# Patient Record
Sex: Female | Born: 1945 | Race: White | Hispanic: No | Smoking: Never smoker
Health system: Southern US, Community
[De-identification: ages and names within clinical notes are randomized; demographics above are authoritative.]

## PROBLEM LIST (undated history)

## (undated) DIAGNOSIS — Z860100 Personal history of colon polyps, unspecified: Secondary | ICD-10-CM

## (undated) DIAGNOSIS — F32A Depression, unspecified: Secondary | ICD-10-CM

## (undated) DIAGNOSIS — H04123 Dry eye syndrome of bilateral lacrimal glands: Secondary | ICD-10-CM

## (undated) DIAGNOSIS — F329 Major depressive disorder, single episode, unspecified: Secondary | ICD-10-CM

## (undated) DIAGNOSIS — R350 Frequency of micturition: Secondary | ICD-10-CM

## (undated) DIAGNOSIS — E785 Hyperlipidemia, unspecified: Secondary | ICD-10-CM

## (undated) DIAGNOSIS — R102 Pelvic and perineal pain: Secondary | ICD-10-CM

## (undated) DIAGNOSIS — R3915 Urgency of urination: Secondary | ICD-10-CM

## (undated) HISTORY — PX: CATARACT EXTRACTION W/ INTRAOCULAR LENS  IMPLANT, BILATERAL: SHX1307

## (undated) HISTORY — DX: Hyperlipidemia, unspecified: E78.5

## (undated) HISTORY — DX: Depression, unspecified: F32.A

## (undated) HISTORY — PX: WISDOM TOOTH EXTRACTION: SHX21

## (undated) HISTORY — DX: Major depressive disorder, single episode, unspecified: F32.9

---

## 1968-12-08 HISTORY — PX: TUMOR REMOVAL: SHX12

## 1992-04-09 HISTORY — PX: NASAL SINUS SURGERY: SHX719

## 2002-10-29 ENCOUNTER — Encounter: Payer: Self-pay | Admitting: Family Medicine

## 2002-10-29 ENCOUNTER — Encounter: Admission: RE | Admit: 2002-10-29 | Discharge: 2002-10-29 | Payer: Self-pay | Admitting: Family Medicine

## 2003-04-28 ENCOUNTER — Encounter: Admission: RE | Admit: 2003-04-28 | Discharge: 2003-04-28 | Payer: Self-pay | Admitting: Family Medicine

## 2003-05-11 ENCOUNTER — Encounter: Admission: RE | Admit: 2003-05-11 | Discharge: 2003-05-11 | Payer: Self-pay | Admitting: Family Medicine

## 2003-05-21 ENCOUNTER — Encounter: Admission: RE | Admit: 2003-05-21 | Discharge: 2003-05-21 | Payer: Self-pay | Admitting: Family Medicine

## 2004-03-22 ENCOUNTER — Other Ambulatory Visit: Admission: RE | Admit: 2004-03-22 | Discharge: 2004-03-22 | Payer: Self-pay | Admitting: Obstetrics & Gynecology

## 2004-05-03 ENCOUNTER — Encounter: Payer: Self-pay | Admitting: Internal Medicine

## 2004-08-31 ENCOUNTER — Ambulatory Visit: Payer: Self-pay | Admitting: Internal Medicine

## 2004-09-29 ENCOUNTER — Ambulatory Visit: Payer: Self-pay | Admitting: Internal Medicine

## 2004-10-05 LAB — HM COLONOSCOPY

## 2004-10-25 ENCOUNTER — Ambulatory Visit: Payer: Self-pay | Admitting: Internal Medicine

## 2005-01-17 ENCOUNTER — Ambulatory Visit: Payer: Self-pay | Admitting: Internal Medicine

## 2005-06-07 ENCOUNTER — Other Ambulatory Visit: Admission: RE | Admit: 2005-06-07 | Discharge: 2005-06-07 | Payer: Self-pay | Admitting: Obstetrics & Gynecology

## 2005-06-26 ENCOUNTER — Ambulatory Visit: Payer: Self-pay | Admitting: Internal Medicine

## 2005-07-04 ENCOUNTER — Ambulatory Visit: Payer: Self-pay | Admitting: Internal Medicine

## 2005-08-20 ENCOUNTER — Ambulatory Visit: Payer: Self-pay | Admitting: Internal Medicine

## 2005-09-17 ENCOUNTER — Ambulatory Visit: Payer: Self-pay | Admitting: Internal Medicine

## 2006-01-10 ENCOUNTER — Ambulatory Visit: Payer: Self-pay | Admitting: Internal Medicine

## 2006-02-11 ENCOUNTER — Ambulatory Visit (HOSPITAL_COMMUNITY): Admission: RE | Admit: 2006-02-11 | Discharge: 2006-02-11 | Payer: Self-pay | Admitting: Internal Medicine

## 2006-03-28 ENCOUNTER — Encounter (INDEPENDENT_AMBULATORY_CARE_PROVIDER_SITE_OTHER): Payer: Self-pay | Admitting: Specialist

## 2006-03-28 ENCOUNTER — Ambulatory Visit (HOSPITAL_COMMUNITY): Admission: RE | Admit: 2006-03-28 | Discharge: 2006-03-29 | Payer: Self-pay | Admitting: Obstetrics & Gynecology

## 2006-03-28 HISTORY — PX: LAPAROSCOPIC ASSISTED VAGINAL HYSTERECTOMY: SHX5398

## 2006-04-21 ENCOUNTER — Ambulatory Visit (HOSPITAL_COMMUNITY): Admission: AD | Admit: 2006-04-21 | Discharge: 2006-04-21 | Payer: Self-pay | Admitting: Obstetrics and Gynecology

## 2006-04-21 HISTORY — PX: REPAIR VAGINAL CUFF: SHX6067

## 2006-04-29 ENCOUNTER — Ambulatory Visit: Payer: Self-pay | Admitting: Internal Medicine

## 2006-04-29 LAB — CONVERTED CEMR LAB: Pap Smear: NORMAL

## 2006-06-11 ENCOUNTER — Ambulatory Visit: Payer: Self-pay | Admitting: Internal Medicine

## 2006-08-09 ENCOUNTER — Ambulatory Visit: Payer: Self-pay | Admitting: Internal Medicine

## 2007-03-13 ENCOUNTER — Encounter: Payer: Self-pay | Admitting: Internal Medicine

## 2007-03-13 DIAGNOSIS — F329 Major depressive disorder, single episode, unspecified: Secondary | ICD-10-CM

## 2007-03-13 DIAGNOSIS — Z860100 Personal history of colon polyps, unspecified: Secondary | ICD-10-CM | POA: Insufficient documentation

## 2007-03-13 DIAGNOSIS — F3289 Other specified depressive episodes: Secondary | ICD-10-CM | POA: Insufficient documentation

## 2007-03-13 DIAGNOSIS — J45909 Unspecified asthma, uncomplicated: Secondary | ICD-10-CM | POA: Insufficient documentation

## 2007-03-13 DIAGNOSIS — N318 Other neuromuscular dysfunction of bladder: Secondary | ICD-10-CM

## 2007-03-13 DIAGNOSIS — J309 Allergic rhinitis, unspecified: Secondary | ICD-10-CM | POA: Insufficient documentation

## 2007-03-13 DIAGNOSIS — E785 Hyperlipidemia, unspecified: Secondary | ICD-10-CM

## 2007-04-16 ENCOUNTER — Ambulatory Visit: Payer: Self-pay | Admitting: Internal Medicine

## 2007-04-16 ENCOUNTER — Telehealth: Payer: Self-pay | Admitting: Internal Medicine

## 2007-07-22 ENCOUNTER — Ambulatory Visit: Payer: Self-pay | Admitting: Internal Medicine

## 2007-10-06 ENCOUNTER — Telehealth (INDEPENDENT_AMBULATORY_CARE_PROVIDER_SITE_OTHER): Payer: Self-pay | Admitting: *Deleted

## 2007-10-06 ENCOUNTER — Telehealth: Payer: Self-pay | Admitting: Internal Medicine

## 2007-10-06 ENCOUNTER — Ambulatory Visit: Payer: Self-pay | Admitting: Internal Medicine

## 2007-10-06 DIAGNOSIS — R1013 Epigastric pain: Secondary | ICD-10-CM

## 2007-10-07 ENCOUNTER — Encounter: Payer: Self-pay | Admitting: Internal Medicine

## 2007-10-15 ENCOUNTER — Ambulatory Visit: Payer: Self-pay | Admitting: Internal Medicine

## 2007-10-15 LAB — CONVERTED CEMR LAB
ALT: 15 units/L (ref 0–35)
AST: 18 units/L (ref 0–37)
Albumin: 3.6 g/dL (ref 3.5–5.2)
Alkaline Phosphatase: 51 units/L (ref 39–117)
BUN: 18 mg/dL (ref 6–23)
Basophils Absolute: 0 10*3/uL (ref 0.0–0.1)
Basophils Relative: 0.8 % (ref 0.0–1.0)
Bilirubin Urine: NEGATIVE
Bilirubin, Direct: 0.1 mg/dL (ref 0.0–0.3)
CO2: 32 meq/L (ref 19–32)
CRP, High Sensitivity: 1 (ref 0.00–5.00)
Calcium: 9.2 mg/dL (ref 8.4–10.5)
Chloride: 104 meq/L (ref 96–112)
Cholesterol: 231 mg/dL (ref 0–200)
Creatinine, Ser: 0.7 mg/dL (ref 0.4–1.2)
Direct LDL: 130.7 mg/dL
Eosinophils Absolute: 0.7 10*3/uL (ref 0.0–0.7)
Eosinophils Relative: 17 % — ABNORMAL HIGH (ref 0.0–5.0)
GFR calc Af Amer: 110 mL/min
GFR calc non Af Amer: 91 mL/min
Glucose, Bld: 96 mg/dL (ref 70–99)
HCT: 39.5 % (ref 36.0–46.0)
HDL: 85 mg/dL (ref >39.0)
Hemoglobin, Urine: NEGATIVE
Hemoglobin: 13.5 g/dL (ref 12.0–15.0)
Ketones, ur: NEGATIVE mg/dL
Leukocytes, UA: NEGATIVE
Lymphocytes Relative: 25.3 % (ref 12.0–46.0)
MCHC: 34.1 g/dL (ref 30.0–36.0)
MCV: 95.5 fL (ref 78.0–100.0)
Monocytes Absolute: 0.5 10*3/uL (ref 0.1–1.0)
Monocytes Relative: 10.7 % (ref 3.0–12.0)
Neutro Abs: 2 10*3/uL (ref 1.4–7.7)
Neutrophils Relative %: 46.2 % (ref 43.0–77.0)
Nitrite: NEGATIVE
Platelets: 176 10*3/uL (ref 150–400)
Potassium: 5.1 meq/L (ref 3.5–5.1)
RBC: 4.13 M/uL (ref 3.87–5.11)
RDW: 13.2 % (ref 11.5–14.6)
Sodium: 141 meq/L (ref 135–145)
Specific Gravity, Urine: 1.01 (ref 1.000–1.03)
TSH: 3.2 microintl units/mL (ref 0.35–5.50)
Total Bilirubin: 0.9 mg/dL (ref 0.3–1.2)
Total CHOL/HDL Ratio: 2.7
Total Protein, Urine: NEGATIVE mg/dL
Total Protein: 6.1 g/dL (ref 6.0–8.3)
Triglycerides: 53 mg/dL (ref 0–149)
Urine Glucose: NEGATIVE mg/dL
Urobilinogen, UA: 0.2 (ref 0.0–1.0)
VLDL: 11 mg/dL (ref 0–40)
WBC: 4.3 10*3/uL — ABNORMAL LOW (ref 4.5–10.5)
pH: 7 (ref 5.0–8.0)

## 2007-10-21 ENCOUNTER — Ambulatory Visit: Payer: Self-pay | Admitting: Internal Medicine

## 2007-10-21 DIAGNOSIS — L259 Unspecified contact dermatitis, unspecified cause: Secondary | ICD-10-CM

## 2007-11-18 ENCOUNTER — Telehealth: Payer: Self-pay | Admitting: Internal Medicine

## 2007-11-20 ENCOUNTER — Encounter: Payer: Self-pay | Admitting: Internal Medicine

## 2008-02-04 LAB — CONVERTED CEMR LAB: Pap Smear: NORMAL

## 2008-08-07 HISTORY — PX: BUNIONECTOMY: SHX129

## 2008-10-26 ENCOUNTER — Telehealth: Payer: Self-pay | Admitting: Family Medicine

## 2008-11-08 ENCOUNTER — Ambulatory Visit: Payer: Self-pay | Admitting: Internal Medicine

## 2008-11-30 ENCOUNTER — Ambulatory Visit: Payer: Self-pay | Admitting: Internal Medicine

## 2008-11-30 LAB — CONVERTED CEMR LAB
ALT: 14 units/L (ref 0–35)
AST: 18 units/L (ref 0–37)
Albumin: 3.8 g/dL (ref 3.5–5.2)
Alkaline Phosphatase: 48 units/L (ref 39–117)
BUN: 22 mg/dL (ref 6–23)
Basophils Absolute: 0 10*3/uL (ref 0.0–0.1)
Basophils Relative: 0.2 % (ref 0.0–3.0)
Bilirubin, Direct: 0.1 mg/dL (ref 0.0–0.3)
CO2: 32 meq/L (ref 19–32)
Calcium: 9.1 mg/dL (ref 8.4–10.5)
Chloride: 106 meq/L (ref 96–112)
Cholesterol: 228 mg/dL — ABNORMAL HIGH (ref 0–200)
Creatinine, Ser: 0.7 mg/dL (ref 0.4–1.2)
Direct LDL: 139.2 mg/dL
Eosinophils Absolute: 0.6 10*3/uL (ref 0.0–0.7)
Eosinophils Relative: 11.3 % — ABNORMAL HIGH (ref 0.0–5.0)
GFR calc non Af Amer: 90.15 mL/min (ref >60)
Glucose, Bld: 72 mg/dL (ref 70–99)
HCT: 40 % (ref 36.0–46.0)
HDL: 80.6 mg/dL (ref >39.00)
Hemoglobin: 13.5 g/dL (ref 12.0–15.0)
Lymphocytes Relative: 28 % (ref 12.0–46.0)
Lymphs Abs: 1.4 10*3/uL (ref 0.7–4.0)
MCHC: 33.8 g/dL (ref 30.0–36.0)
MCV: 97.2 fL (ref 78.0–100.0)
Monocytes Absolute: 0.5 10*3/uL (ref 0.1–1.0)
Monocytes Relative: 10.1 % (ref 3.0–12.0)
Neutro Abs: 2.4 10*3/uL (ref 1.4–7.7)
Neutrophils Relative %: 50.4 % (ref 43.0–77.0)
Platelets: 173 10*3/uL (ref 150.0–400.0)
Potassium: 4.7 meq/L (ref 3.5–5.1)
RBC: 4.11 M/uL (ref 3.87–5.11)
RDW: 13.4 % (ref 11.5–14.6)
Sodium: 141 meq/L (ref 135–145)
TSH: 4.9 microintl units/mL (ref 0.35–5.50)
Total Bilirubin: 0.8 mg/dL (ref 0.3–1.2)
Total CHOL/HDL Ratio: 3
Total Protein: 6.4 g/dL (ref 6.0–8.3)
Triglycerides: 70 mg/dL (ref 0.0–149.0)
VLDL: 14 mg/dL (ref 0.0–40.0)
Vit D, 25-Hydroxy: 28 ng/mL — ABNORMAL LOW (ref 30–89)
WBC: 4.9 10*3/uL (ref 4.5–10.5)

## 2008-12-02 ENCOUNTER — Telehealth: Payer: Self-pay | Admitting: Internal Medicine

## 2009-01-31 LAB — CONVERTED CEMR LAB: Pap Smear: NORMAL

## 2009-04-11 ENCOUNTER — Ambulatory Visit: Payer: Self-pay | Admitting: Family

## 2009-04-13 ENCOUNTER — Telehealth: Payer: Self-pay | Admitting: Internal Medicine

## 2009-04-22 ENCOUNTER — Ambulatory Visit: Payer: Self-pay | Admitting: Internal Medicine

## 2009-05-23 ENCOUNTER — Ambulatory Visit: Payer: Self-pay | Admitting: Internal Medicine

## 2009-05-23 ENCOUNTER — Telehealth: Payer: Self-pay | Admitting: Internal Medicine

## 2009-05-24 ENCOUNTER — Encounter: Payer: Self-pay | Admitting: Internal Medicine

## 2009-09-09 ENCOUNTER — Ambulatory Visit: Payer: Self-pay | Admitting: Internal Medicine

## 2009-10-04 ENCOUNTER — Telehealth: Payer: Self-pay | Admitting: Internal Medicine

## 2009-10-06 ENCOUNTER — Encounter: Payer: Self-pay | Admitting: Internal Medicine

## 2009-11-29 ENCOUNTER — Encounter: Payer: Self-pay | Admitting: Internal Medicine

## 2009-11-29 ENCOUNTER — Ambulatory Visit: Payer: Self-pay | Admitting: Family

## 2009-11-29 LAB — CONVERTED CEMR LAB
Bilirubin Urine: NEGATIVE
Glucose, Urine, Semiquant: NEGATIVE
Ketones, urine, test strip: NEGATIVE
Nitrite: POSITIVE
Protein, U semiquant: NEGATIVE
Specific Gravity, Urine: 1.015
Urobilinogen, UA: 0.2
WBC Urine, dipstick: NEGATIVE
pH: 5

## 2009-12-05 ENCOUNTER — Telehealth: Payer: Self-pay | Admitting: Family

## 2009-12-06 ENCOUNTER — Ambulatory Visit: Payer: Self-pay | Admitting: Diagnostic Radiology

## 2009-12-06 ENCOUNTER — Ambulatory Visit: Payer: Self-pay | Admitting: Family

## 2009-12-06 ENCOUNTER — Ambulatory Visit (HOSPITAL_BASED_OUTPATIENT_CLINIC_OR_DEPARTMENT_OTHER): Admission: RE | Admit: 2009-12-06 | Discharge: 2009-12-06 | Payer: Self-pay | Admitting: Internal Medicine

## 2009-12-06 DIAGNOSIS — R3129 Other microscopic hematuria: Secondary | ICD-10-CM

## 2009-12-13 ENCOUNTER — Encounter: Payer: Self-pay | Admitting: Internal Medicine

## 2010-01-19 LAB — CONVERTED CEMR LAB

## 2010-01-20 ENCOUNTER — Ambulatory Visit: Payer: Self-pay | Admitting: Internal Medicine

## 2010-01-20 LAB — HM MAMMOGRAPHY

## 2010-01-30 ENCOUNTER — Telehealth: Payer: Self-pay | Admitting: Internal Medicine

## 2010-01-31 ENCOUNTER — Ambulatory Visit: Payer: Self-pay | Admitting: Internal Medicine

## 2010-01-31 DIAGNOSIS — J018 Other acute sinusitis: Secondary | ICD-10-CM

## 2010-02-14 ENCOUNTER — Telehealth: Payer: Self-pay | Admitting: Internal Medicine

## 2010-05-08 ENCOUNTER — Ambulatory Visit (INDEPENDENT_AMBULATORY_CARE_PROVIDER_SITE_OTHER)
Admission: RE | Admit: 2010-05-08 | Discharge: 2010-05-08 | Payer: BC Managed Care – PPO | Source: Home / Self Care | Attending: Internal Medicine | Admitting: Internal Medicine

## 2010-05-08 DIAGNOSIS — Z780 Asymptomatic menopausal state: Secondary | ICD-10-CM

## 2010-05-08 DIAGNOSIS — F329 Major depressive disorder, single episode, unspecified: Secondary | ICD-10-CM

## 2010-05-08 DIAGNOSIS — J018 Other acute sinusitis: Secondary | ICD-10-CM

## 2010-05-09 NOTE — Medication Information (Signed)
Summary: Prior Authorization for Fexofenadine/[facility]  Prior Authorization for Fexofenadine/[facility]   Imported By: Lanelle Bal 10/13/2009 12:38:34  _____________________________________________________________________  External Attachment:    Type:   Image     Comment:   External Document

## 2010-05-09 NOTE — Progress Notes (Signed)
Summary: refill clarification  Phone Note Call from Patient Call back at [phone]   Caller: Patient Call For: D. Thomos Lemons DO Summary of Call: Received voice message from pt stating that she had not received refills on Vesicare and Cymbalta. Stated that refills were supposed to have been sent to [facility]. Pt has also checked with [facility] and they do not have Rxs.  [employer] and spoke to [employer]. She verified that they did not receive electronic Rxs.  Refills given verbally per 01/20/10 office visit. Pt notified and will call us back for a local supply if she has not received Rxs from [facility] before she runs out. Nicki Guadalajara Fergerson CMA Duncan Dull)  February 14, 2010 11:07 AM

## 2010-05-09 NOTE — Progress Notes (Signed)
Summary: Fexofenadine Refill  Phone Note From Pharmacy   Caller: [facility] [address] [phone]* Summary of Call: Faxed recieved from [facility] for Prior Authorization on Fexofenadine. Prior Authorization questionaire has been received, awaiting signature from Dr Artist Pais Initial call taken by: Glendell Docker CMA,  October 04, 2009 11:08 AM  Follow-up for Phone Call        call placed to patient to verify that she is still taking the medication. Patient states that she hasd not taken the medication for awhile, but she is having such a bad time wtih allergies she is going to need something that does not make her sleepy. She states that she has tried claritin, zyrtec which does nothing for her and xyzal cost too much and she can not afford the cost of that medication. Follow-up by: Glendell Docker CMA,  October 04, 2009 11:13 AM  Additional Follow-up for Phone Call Additional follow up Details #1::        questionaire faxed to [facility], awaiting approval/denial status Additional Follow-up by: Glendell Docker CMA,  October 06, 2009 2:49 PM    Additional Follow-up for Phone Call Additional follow up Details #2::    Approval received from [facility] from 09/15/09 through 10/06/10. Left message on pharmacy voicemail and pt cell voicemail that med has been approved.  Nicki Guadalajara Fergerson CMA Duncan Dull)  October 07, 2009 9:04 AM

## 2010-05-09 NOTE — Progress Notes (Signed)
Summary: UTI  Phone Note Call from Patient Call back at [phone]   Caller: Patient Call For: Peggyann Juba, NP Summary of Call: Pt states she has completed antibiotic for UTI and is still having lower back pain & some urinary frequency. Does pt need another round of abx or should she see a urologist?  Please advise. Nicki Guadalajara Fergerson CMA Duncan Dull)  December 05, 2009 8:52 AM   Follow-up for Phone Call        Returned pt's call +low back pain- bilateral.  Some bladder discomfort.  Case discussed with Dr. Artist Pais, will try Azo 2 tabs PO TID PRN and increase fluid intake.  She should try this before we consider referral to urology. Please instruct patient  to call if symptoms worsen or do not improve.  Follow-up by: Lemont Fillers FNP,  December 05, 2009 2:17 PM  Additional Follow-up for Phone Call Additional follow up Details #1::        Pt notified of Melissa's instructions and voices understanding. Nicki Guadalajara Fergerson CMA Duncan Dull)  December 05, 2009 2:41 PM     New/Updated Medications: AZO-STANDARD 95 MG TABS (PHENAZOPYRIDINE HCL) 2 tabs by mouth three times a day as needed for bladder pain. l

## 2010-05-09 NOTE — Assessment & Plan Note (Signed)
Summary: 2 wk. f/u -per Dr.Yoo- jr   Vital Signs:  Patient profile:   65 year old female Weight:      121.25 pounds BMI:     19.79 O2 Sat:      100 % on Room air Temp:     98.6 degrees F oral Pulse rate:   69 / minute Pulse rhythm:   regular Resp:     18 per minute BP sitting:   120 / 80  (right arm) Cuff size:   regular  Vitals Entered By: Glendell Docker CMA (April 22, 2009 3:36 PM)  O2 Flow:  Room air  Contraindications/Deferment of Procedures/Staging:    Test/Procedure: FLU VAX    Reason for deferment: patient declined   Primary Care Provider:  DThomos Lemons DO  CC:  2 Week follow up medication.  History of Present Illness: 2 Week follow up   65 y/o white female for f/u with anxiety/depression - stress rxn.  she broke up relationship with significant other.  she is having tough time sleeping. wellbutrin not helping.   feeling overwhelmed.  no morbid thoughts.  she is seeing counselor - Loraine Maple.      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   Social History: Separated  Never Smoked Alcohol use-yes (socially) Occupation:  Cytogeneticist of undergraduate admissions   Review of Systems       chronic insomnia .  does not want to take ambien long term due to fear of dependency  Physical Exam  General:  alert, well-developed, and well-nourished.   Lungs:  normal respiratory effort, normal breath sounds, no crackles, and no wheezes.   Heart:  normal rate, regular rhythm, and no gallop.   Neurologic:  cranial nerves II-XII intact and gait normal.   Psych:   normally interactive, good eye contact, not anxious appearing, and not depressed appearing.     Impression & Recommendations:  Problem # 1:  DEPRESSION (ICD-311) Assessment Unchanged Change to Lexapro.  DC wellbutrin.  samples of lexapro provided.  use lorazepam to facilitate sleep.  continue counseling.  Patient advised to call office if symptoms persist or worsen. f/u 1 month The following medications were removed from the medication list:    Citalopram Hydrobromide 40 Mg Tabs (Citalopram hydrobromide) .Marland Kitchen... Take 1 tablet by mouth once a day    Wellbutrin Xl 150 Mg Xr24h-tab (Bupropion hcl) ..... One tablet by mouth daily x 4 days, then increase to two tablets by mouth daily Her updated medication list for this problem includes:    Lexapro 20 Mg Tabs (Escitalopram oxalate) ..... One by mouth qd    Lorazepam 1 Mg Tabs (Lorazepam) ..... One by mouth at bedtime as needed  Complete Medication List: 1)  Vesicare 5 Mg Tabs (Solifenacin succinate) .... Take 1 tablet by mouth once a day 2)  Vivelle-dot 0.1 Mg/24hr Pttw (Estradiol) .... Apply 1 patch to skin twice a week 3)  Lexapro 20 Mg Tabs (Escitalopram oxalate) .... One by mouth qd 4)  Lorazepam 1 Mg Tabs (Lorazepam) .... One by mouth at bedtime as needed  Patient Instructions: 1)  Please schedule a follow-up appointment in 1 month. Prescriptions: LORAZEPAM 1 MG TABS (LORAZEPAM) one by mouth at bedtime as needed  #30 x 1   Entered and Authorized by:   D. Thomos Lemons DO   Signed by:   D. Thomos Lemons DO on 04/22/2009   Method used:   Print then Give to Patient   RxID:   [id number] LEXAPRO 20 MG TABS (ESCITALOPRAM OXALATE) one by mouth qd  #30 x 2   Entered and Authorized by:   D. Thomos Lemons DO   Signed by:   D. Thomos Lemons DO on 04/22/2009   Method used:   Electronically to        [facility] [address] # [phone]* (retail)       [address]       [place], [place]  [address]       Ph: [phone]       Fax: [phone]   RxID:    [id number]   Current Allergies (reviewed today): No known allergies    Preventive Care Screening  Mammogram:    Date:  01/04/2009    Results:  normal     Immunization History:  Influenza Immunization History:    Influenza:  declined (04/22/2009)

## 2010-05-09 NOTE — Progress Notes (Signed)
Summary: Lexapro-Prior Auth  Phone Note Outgoing Call   Call placed by: Glendell Docker CMA,  May 23, 2009 4:43 PM Call placed to: Insurer Summary of Call: Prior Authorization on Lexapro 20mg  .  Rx authorization awaiting approval.  [id number] Initial call taken by: Glendell Docker CMA,  May 23, 2009 4:55 PM  Follow-up for Phone Call        Lexapro has been n approved and authorized for a period of one year Follow-up by: Glendell Docker CMA,  May 24, 2009 2:43 PM     Appended Document: Lexapro-Prior Auth Rx approved from 05/03/2009 through 04/08/2098

## 2010-05-09 NOTE — Progress Notes (Signed)
Summary: [facility] case # [id number] for fexofenadine 180 mg   Phone Note Outgoing Call   Call placed by: Marj Call placed to: [facility]  Summary of Call: [facility] case # [id number] for Fexofenadine 180mg  tablets prior auth.  Form to Darlene  Initial call taken by: Roselle Locus,  October 04, 2009 9:54 AM

## 2010-05-09 NOTE — Progress Notes (Signed)
Summary: Status Update  Phone Note Call from Patient Call back at Home Phone [phone]   Caller: Patient Call For: D. Thomos Lemons DO Summary of Call: patient called and left voice message stating she is having some allergy related problems. Her message states she is scheduled to fly out to [place] on Thursday, and she would like to know if Dr Artist Pais would prescribe her some anitbiotics. She states she is having chest congestion, and sore throat. Initial call taken by: Glendell Docker CMA,  January 30, 2010 11:24 AM  Follow-up for Phone Call        no, I can not rx abx with examining pt.   Follow-up by: D. Thomos Lemons DO,  January 30, 2010 12:17 PM  Additional Follow-up for Phone Call Additional follow up Details #1::        call placed to patient at [phone], she has been advised per Dr Artist Pais instructions. Appointment scheduled for  10/25 @ 10am Additional Follow-up by: Glendell Docker CMA,  January 30, 2010 1:59 PM

## 2010-05-09 NOTE — Assessment & Plan Note (Signed)
Summary: POSS UTI/DT--Rm 4   Vital Signs:  Patient profile:   65 year old female Height:      65.5 inches Weight:      129 pounds BMI:     21.22 Temp:     98.1 degrees F oral Pulse rate:   60 / minute Pulse rhythm:   regular Resp:     16 per minute BP sitting:   100 / 76  (right arm) Cuff size:   regular  Vitals Entered By: Mervin Kung CMA Duncan Dull) (November 29, 2009 3:34 PM) CC: Room 3   Abdominal discomfort x 1 week, urinary frequency, feels achy all over. Would like refills on Vesicare and Cymbalta. Is Patient Diabetic? No   Primary Care Ronnetta Currington:  Dondra Spry DO  CC:  Room 3   Abdominal discomfort x 1 week, urinary frequency, and feels achy all over. Would like refills on Vesicare and Cymbalta..  History of Present Illness: Ms Philbin is a 65 year old female who presents today with complaint of suprapubic abdominal pain x 1 week.  Felt feverish this AM- but did not check temperature. Denies dysuria, or hematuria. Notes+frequency or urination- but also has history of overactive bladder.   +bilateral low back pain.  Notes that she has felt achey.   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 Lungs are clear to auscultation, no crackles or wheezes. Heart:  Normal rate and regular rhythm. S1 and S2 normal without gallop, murmur, click, rub or other extra sounds. Abdomen:  Soft, + bowel sounds.  + suprapubic tenderness to palpation without guarding. Msk:  Neg CVAT bilaterally   Impression &  Recommendations:  Problem # 1:  UTI (ICD-599.0) Assessment New Will treat with Cipro, send for culture.  Pt instructed to call if symptoms worsen or do not improve. Her updated medication list for this problem includes:    Vesicare 5 Mg Tabs (Solifenacin succinate) .Marland Kitchen... Take 1 tablet by mouth once a day    Cipro 500 Mg Tabs (Ciprofloxacin hcl) ..... One tab by mouth two times a day x 7 days  Orders: T-Culture, Urine ([id number]) Specimen Handling (09811) UA Dipstick w/o Micro (manual) (81002)  Complete Medication List: 1)  Vesicare 5 Mg Tabs (Solifenacin succinate) .... Take 1 tablet by mouth once a day 2)  Vivelle-dot 0.1 Mg/24hr Pttw (Estradiol) .... Apply 1 patch to skin twice a week 3)  Lorazepam 1 Mg Tabs (Lorazepam) .... One by mouth at bedtime as needed 4)  Cymbalta 60 Mg Cpep (Duloxetine hcl) .... One by mouth once daily 5)  Vigamox 0.5 % Soln (Moxifloxacin hcl) .Marland Kitchen.. 1 drop three times a day to both eyes 6)  Bromday 0.09 % Soln (Bromfenac sodium) .Marland Kitchen.. 1 drop three times a day in both eyes. 7)  Durezol 0.05 % Emul (Difluprednate) .Marland Kitchen.. 1 drop three times a day in both eyes. 8)  Cipro 500 Mg Tabs (Ciprofloxacin hcl) .... One tab by mouth two times a day x 7 days  Patient Instructions: 1)  Call if fever over 101, worsening low back pain, or if  your symptoms do not improve. Prescriptions: CIPRO 500 MG TABS (CIPROFLOXACIN HCL) one tab by mouth two times a day x 7 days  #14 x 0   Entered and Authorized by:   Lemont Fillers FNP   Signed by:   Lemont Fillers FNP on 11/29/2009   Method used:   Electronically to        [facility]. [phone]* (retail)       [address]       [place], [place]  [address]       Ph: [phone]       Fax: [phone]   RxID:   [id number]   Current Allergies (reviewed today): No known allergies   Laboratory Results   Urine Tests  Date/Time Received: 11/29/09 Date/Time Reported: Mervin Kung CMA (AAMA)  November 29, 2009 3:54 PM   Routine Urinalysis   Color: yellow Appearance: Clear Glucose: negative   (Normal Range: Negative) Bilirubin: negative   (Normal Range: Negative) Ketone: negative   (Normal Range: Negative) Spec. Gravity: 1.015   (Normal Range: 1.003-1.035) Blood: moderate   (Normal Range: Negative) pH: 5.0   (Normal Range: 5.0-8.0) Protein: negative   (Normal Range: Negative) Urobilinogen: 0.2   (Normal Range: 0-1) Nitrite: positive   (Normal Range: Negative) Leukocyte Esterace: negative   (Normal Range: Negative)    Comments: Urine cultured. Nicki Guadalajara Fergerson CMA Duncan Dull)  November 29, 2009 3:54 PM

## 2010-05-09 NOTE — Assessment & Plan Note (Signed)
Summary: anxiety/sleep deprived/hea   Vital Signs:  Patient profile:   64 year old female Weight:      124.75 pounds Pulse rate:   64 / minute BP sitting:   120 / 80  Vitals Entered By: Kandice Hams (April 11, 2009 4:03 PM) CC: c/o increased anxiety pt on citalopram , also needs somemthing to help her sleep   Primary Care Provider:  Dondra Spry DO  CC:  c/o increased anxiety pt on citalopram  and also needs somemthing to help her sleep.  History of Present Illness: Allison Riley is a 65 year old female who presents today with c/o depression as well as insomnia.  Notes that she recently had some "devastating news" which has made her depresssion much worse.    Allergies: No Known Drug Allergies  Review of Systems       patient notes difficulty sleeping- falls asleep but then wakes up several times a day.  Patient notes increased tearfulness,  doesn't feel that the citalopram is helping her.     Impression & Recommendations:  Problem # 1:  DEPRESSION (ICD-311) Assessment Deteriorated Will add wellbutrin to patient's citalopram and plan to bring her back in 1 month for further evaluation. Will also add ambien on a as needed basis for insomnia. Her updated medication list for this problem includes:    Citalopram Hydrobromide 40 Mg Tabs (Citalopram hydrobromide) .Marland Kitchen... Take 1 tablet by mouth once a day    Wellbutrin Xl 150 Mg Xr24h-tab (Bupropion hcl) ..... One tablet by mouth daily x 4 days, then increase to two tablets by mouth daily  Complete Medication List: 1)  Citalopram Hydrobromide 40 Mg Tabs (Citalopram hydrobromide) .... Take 1 tablet by mouth once a day 2)  Vesicare 5 Mg Tabs (Solifenacin succinate) .... Take 1 tablet by mouth once a day 3)  Vivelle-dot 0.1 Mg/24hr Pttw (Estradiol) .... Apply 1 patch to skin twice a week 4)  Fexofenadine Hcl 180 Mg Tabs (Fexofenadine hcl) .... Take 1 tablet by mouth once a day 5)  Ambien 5 Mg Tabs (Zolpidem tartrate) .... One tablet by  mouth daily 6)  Wellbutrin Xl 150 Mg Xr24h-tab (Bupropion hcl) .... One tablet by mouth daily x 4 days, then increase to two tablets by mouth daily  Patient Instructions: 1)  Please schedule a follow-up appointment in 1 month. 2)  Call us if your symptoms of insomnia or depression become worse or do not improve in the next few weeks.  Should you ever develop any suicidal thoughts- please go directly to the ER.  Prescriptions: WELLBUTRIN XL 150 MG XR24H-TAB (BUPROPION HCL) one tablet by mouth daily x 4 days, then increase to two tablets by mouth daily  #60 x 1   Entered and Authorized by:   Lemont Fillers FNP   Signed by:   Lemont Fillers FNP on 04/11/2009   Method used:   Electronically to        [facility] [address] # [phone]* (retail)       [address]       [place], [place]  [address]       Ph: [phone]       Fax: [phone]   RxID:   [id number] AMBIEN 5 MG TABS (ZOLPIDEM TARTRATE) one tablet by mouth daily  #30 x 0   Entered and Authorized by:   Lemont Fillers FNP   Signed by:   Lemont Fillers FNP on 04/11/2009   Method used:  Print then Give to Patient   RxID:   [id number]

## 2010-05-09 NOTE — Medication Information (Signed)
Summary: Approval for Fexofenadine/[facility]  Approval for Fexofenadine/[facility]   Imported By: Lanelle Bal 10/14/2009 09:23:42  _____________________________________________________________________  External Attachment:    Type:   Image     Comment:   External Document

## 2010-05-09 NOTE — Consult Note (Signed)
Summary: [facility]  [facility]   Imported By: Lanelle Bal 12/22/2009 12:32:26  _____________________________________________________________________  External Attachment:    Type:   Image     Comment:   External Document

## 2010-05-09 NOTE — Assessment & Plan Note (Signed)
Summary: FU/MHF   Vital Signs:  Patient profile:   65 year old female Weight:      126.25 pounds BMI:     20.61 O2 Sat:      98 % on Room air Temp:     98.4 degrees F oral Pulse rate:   54 / minute Pulse rhythm:   regular Resp:     16 per minute BP sitting:   120 / 72  (right arm) Cuff size:   regular  Vitals Entered By: Glendell Docker CMA (May 23, 2009 3:31 PM)  O2 Flow:  Room air  Primary Care Provider:  D. Thomos Lemons DO  CC:  Follow up on Lexapro.  History of Present Illness: 65 y/o white female for follow up. depressive symptoms improved since prev visit. good response to lexapro sleep improved with lorazepam no irritabiltiy  pt previously on citalopram and sertraline.  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    Social History: Separated  Never Smoked Alcohol use-yes (socially) Occupation:  Cytogeneticist of undergraduate admissions    Physical Exam  General:  alert, well-developed, and well-nourished.   Lungs:  normal respiratory effort and normal breath sounds.   Heart:  normal rate, regular rhythm, and no gallop.   Neurologic:  cranial nerves II-XII intact and gait normal.   Psych:  normally interactive and good eye contact.     Impression & Recommendations:  Problem # 1:  DEPRESSION (ICD-311) Assessment Improved good response to lexapro 20 mg.   she was prev on citalopram and sertraline with refractory symptoms.  continue lexapro fill out prior auth Her updated medication list for this  problem includes:    Lexapro 20 Mg Tabs (Escitalopram oxalate) ..... One by mouth qd    Lorazepam 1 Mg Tabs (Lorazepam) ..... One by mouth at bedtime as needed  Complete Medication List: 1)  Vesicare 5 Mg Tabs (Solifenacin succinate) .... Take 1 tablet by mouth once a day 2)  Vivelle-dot 0.1 Mg/24hr Pttw (Estradiol) .... Apply 1 patch to skin twice a week 3)  Lexapro 20 Mg Tabs (Escitalopram oxalate) .... One by mouth qd 4)  Lorazepam 1 Mg Tabs (Lorazepam) .... One by mouth at bedtime as needed  Patient Instructions: 1)  Please schedule a follow-up appointment in 2 months.  Current Allergies (reviewed today): No known allergies

## 2010-05-09 NOTE — Assessment & Plan Note (Signed)
Summary: 3 month follow up/mhf   Vital Signs:  Patient profile:   65 year old female Height:      65.5 inches Weight:      129.25 pounds BMI:     21.26 O2 Sat:      98 % on Room air Temp:     98.3 degrees F oral Pulse rate:   60 / minute Pulse rhythm:   regular Resp:     16 per minute BP sitting:   110 / 68  (right arm) Cuff size:   regular  Vitals Entered By: Glendell Docker CMA (September 09, 2009 4:00 PM)  O2 Flow:  Room air CC: Rm 2- 3 Month Follow up , Depression Comments  refill on Lexapro and dicuss continuing Lorazepam   Primary Care Provider:  Dondra Spry DO  CC:  Rm 2- 3 Month Follow up  and Depression.  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      Social History: Separated  Never Smoked Alcohol use-yes (socially)   Occupation:  Cytogeneticist of undergraduate admissions    Physical Exam  General:  alert, well-developed, and well-nourished.   Lungs:  normal respiratory effort and normal breath sounds.   Heart:  normal rate, regular rhythm, and no gallop.   Neurologic:  cranial nerves II-XII intact and gait normal.   Psych:  normally interactive, good eye contact, not anxious appearing, and not depressed appearing.     Impression &  Recommendations:  Problem # 1:  DEPRESSION (ICD-311) Assessment Unchanged no severe depression but feels apathetic.  taper off lexapro while transitioning to cymbalta.  we discussed common side effects. Patient advised to call office if symptoms persist or worsen.  The following medications were removed from the medication list:    Lexapro 20 Mg Tabs (Escitalopram oxalate) ..... One by mouth qd Her updated medication list for this problem includes:    Lorazepam 1 Mg Tabs (Lorazepam) ..... One by mouth at bedtime as needed    Cymbalta 60 Mg Cpep (Duloxetine hcl) ..... One by mouth once daily  Complete Medication List: 1)  Vesicare 5 Mg Tabs (Solifenacin succinate) .... Take 1 tablet by mouth once a day 2)  Vivelle-dot 0.1 Mg/24hr Pttw (Estradiol) .... Apply 1 patch to skin twice a week 3)  Lorazepam 1 Mg Tabs (Lorazepam) .... One by mouth at bedtime as needed 4)  Cymbalta 60 Mg Cpep (Duloxetine hcl) .... One by mouth once daily  Patient Instructions: 1)  Please schedule a follow-up appointment in 2 months. 2)  Please call our office if depressive symptoms get worse Prescriptions: LORAZEPAM 1 MG TABS (LORAZEPAM) one by mouth at bedtime as needed  #30 x 1   Entered and Authorized by:   D.  Thomos Lemons DO   Signed by:   D. Thomos Lemons DO on 09/09/2009   Method used:   Print then Give to Patient   RxID:   [id number] CYMBALTA 60 MG CPEP (DULOXETINE HCL) one by mouth once daily  #30 x 3   Entered and Authorized by:   D. Thomos Lemons DO   Signed by:   D. Thomos Lemons DO on 09/09/2009   Method used:   Electronically to        [facility] [phone]* (retail)       [address]       [place], [place]  [address]       Ph: [phone]       Fax: [phone]   RxID:   [id number]   Current Allergies (reviewed today): No known allergies    Preventive Care Screening  Pap Smear:    Date:  01/31/2009    Results:  normal

## 2010-05-09 NOTE — Assessment & Plan Note (Signed)
Summary: 3 month fu/dt   Vital Signs:  Patient profile:   65 year old female Menstrual status:  hysterectomy Height:      65.5 inches Weight:      130.25 pounds BMI:     21.42 O2 Sat:      97 % on Room air Temp:     98.65 degrees F oral Pulse rate:   66 / minute Pulse rhythm:   regular Resp:     16 per minute BP sitting:   116 / 80  (right arm) Cuff size:   regular  Vitals Entered By: Glendell Docker CMA (January 20, 2010 2:48 PM)  O2 Flow:  Room air  Contraindications/Deferment of Procedures/Staging:    Test/Procedure: PAP Smear    Reason for deferment: hysterectomy  CC: 3 month follow up  Is Patient Diabetic? No Pain Assessment Patient in pain? no      Comments refill on Cymbalta and vesicare 10mg      Menstrual Status hysterectomy Last PAP Result Hyster   Primary Care Nanetta Wiegman:  DThomos Lemons DO  CC:  3 month follow up .  History of Present Illness: 65 y/o white female for f/u  less apathy since switching to cymbalta no nausea no change in sleep  Preventive Screening-Counseling & Management  Alcohol-Tobacco     Smoking Status: never  Allergies (verified): No Known Drug Allergies  Past History:  Past Medical History: Allergic rhinitis Asthma  Colonic polyps, hx of   Depression   Hyperlipidemia    Family History: Father deceased age 82 - cancer, died of GSW Mother deceased age 45 - mother died of suicide Younger brother - Clinical research associate, addicted to prescription drugs?       Social History: Separated  Never Smoked Alcohol use-yes (socially)   Occupation:  Cytogeneticist of undergraduate admissions     Physical Exam  General:  alert, well-developed, and well-nourished.   Lungs:  Normal respiratory effort, chest expands symmetrically. Lungs are clear to auscultation, no crackles or wheezes. Heart:  Normal rate and regular rhythm. S1 and S2 normal without gallop, murmur, click, rub or other extra sounds. Psych:  normally interactive, good eye  contact, not anxious appearing, and not depressed appearing.     Impression & Recommendations:  Problem # 1:  DEPRESSION (ICD-311) Assessment Improved Maintain current medication regimen.  Her updated medication list for this problem includes:    Lorazepam 1 Mg Tabs (Lorazepam) ..... One by mouth at bedtime as needed    Cymbalta 60 Mg Cpep (Duloxetine hcl) ..... One by mouth once daily  Complete Medication List: 1)  Vesicare 10 Mg Tabs (Solifenacin succinate) .... One tablet by mouth daily 2)  Vivelle-dot 0.1 Mg/24hr Pttw (Estradiol) .... Apply 1 patch to skin twice a week 3)  Lorazepam 1 Mg Tabs (Lorazepam) .... One by mouth at bedtime as needed 4)  Cymbalta 60 Mg Cpep (Duloxetine hcl) .... One by mouth once daily  Patient Instructions: 1)  Please schedule a follow-up appointment in 6 months. Prescriptions: CYMBALTA 60 MG CPEP (DULOXETINE HCL) one by mouth once daily  #90 x 1   Entered and Authorized by:   D. Thomos Lemons DO   Signed by:   D. Thomos Lemons DO on 01/20/2010   Method used:   Electronically to        [facility]* (retail)             ,          Ph: [phone]  Fax: [phone]   RxID:   [id number] VESICARE 10 MG TABS (SOLIFENACIN SUCCINATE) one tablet by mouth daily  #90 x 1   Entered and Authorized by:   D. Thomos Lemons DO   Signed by:   D. Thomos Lemons DO on 01/20/2010   Method used:   Electronically to        [facility]* (retail)             ,          Ph: [phone]       Fax: [phone]   RxID:   [id number]     Current Allergies (reviewed today): No known allergies     Preventive Care Screening  Mammogram:    Date:  01/20/2010    Results:  Done  Pap Smear:    Date:  01/19/2010    Results:  Hyster

## 2010-05-09 NOTE — Medication Information (Signed)
Summary: Prior Authorization for Lexapro/[facility]  Prior Authorization for Lexapro/[facility]   Imported By: Lanelle Bal 06/02/2009 12:35:00  _____________________________________________________________________  External Attachment:    Type:   Image     Comment:   External Document

## 2010-05-09 NOTE — Assessment & Plan Note (Signed)
Summary: ? SINUS INFECTION/DK   Vital Signs:  Patient profile:   65 year old female O2 Sat:      99 % on Room air Temp:     98.6 degrees F oral Pulse rate:   68 / minute Pulse rhythm:   regular Resp:     18 per minute BP sitting:   110 / 70  (right arm) Cuff size:   regular  Vitals Entered By: Glendell Docker CMA (January 31, 2010 10:19 AM)  O2 Flow:  Room air CC: ? Sinus infection Is Patient Diabetic? No Pain Assessment Patient in pain? no      Comments c/o nasal drainage, head congestion, productive cough that is worse at night, ongoing for the past several days.   Primary Care Provider:  Dondra Spry DO  CC:  ? Sinus infection.  History of Present Illness: 65 y/o white female c/o sinus congestion at night post nasal gtt sick contacts at worse some wheezing   Preventive Screening-Counseling & Management  Alcohol-Tobacco     Smoking Status: never  Allergies (verified): No Known Drug Allergies  Past History:  Past Medical History: Allergic rhinitis Asthma  Colonic polyps, hx of   Depression   Hyperlipidemia    Past Surgical History: Hysterectomy Tumor behind ear (2130'Q)   Left foot surgery - Bunion  08/2008       Family History: Father deceased age 42 - cancer, died of GSW Mother deceased age 67 - mother died of suicide Younger brother - Clinical research associate, addicted to prescription drugs?       Social History: Separated  Never Smoked  Alcohol use-yes (socially)   Occupation:  Cytogeneticist of undergraduate admissions    Physical Exam  General:  alert, well-developed, and well-nourished.   Ears:  left TM retracted Mouth:  pharyngeal erythema and postnasal drip.   Lungs:  normal respiratory effort and normal breath sounds.   Heart:  normal rate, regular rhythm, and no gallop.     Impression & Recommendations:  Problem # 1:  RHINOSINUSITIS, ACUTE (ICD-461.8)  Her updated medication list for this problem includes:    Cefuroxime Axetil 500 Mg  Tabs (Cefuroxime axetil) ..... One by mouth two times a day    Nasonex 50 Mcg/act Susp (Mometasone furoate) .Marland Kitchen... 2 sprays each nostril qd  Instructed on treatment. Call if symptoms persist or worsen.   Complete Medication List: 1)  Vesicare 10 Mg Tabs (Solifenacin succinate) .... One tablet by mouth daily 2)  Vivelle-dot 0.1 Mg/24hr Pttw (Estradiol) .... Apply 1 patch to skin twice a week 3)  Lorazepam 1 Mg Tabs (Lorazepam) .... One by mouth at bedtime as needed 4)  Cymbalta 60 Mg Cpep (Duloxetine hcl) .... One by mouth once daily 5)  Cefuroxime Axetil 500 Mg Tabs (Cefuroxime axetil) .... One by mouth two times a day 6)  Nasonex 50 Mcg/act Susp (Mometasone furoate) .... 2 sprays each nostril qd  Patient Instructions: 1)  use neil med sinus rinse (over the counter) 2)  Call our office if your symptoms do not  improve or gets worse. Prescriptions: CEFUROXIME AXETIL 500 MG TABS (CEFUROXIME AXETIL) one by mouth two times a day  #14 x 0   Entered and Authorized by:   D. Thomos Lemons DO   Signed by:   D. Thomos Lemons DO on 01/31/2010   Method used:   Electronically to        [facility] [phone]* (retail)  [address]       [place], [place]  [address]       Ph: [phone]       Fax: [phone]   RxID:   [id number]    Orders Added: 1)  Est. Patient Level III [53664]    Current Allergies (reviewed today): No known allergies

## 2010-05-09 NOTE — Assessment & Plan Note (Signed)
Summary: UTI/hea--Rm 5   Vital Signs:  Patient profile:   65 year old female Height:      65.5 inches Weight:      129 pounds BMI:     21.22 Temp:     98.0 degrees F oral Pulse rate:   78 / minute Pulse rhythm:   regular Resp:     18 per minute BP sitting:   139 / 78  (right arm) Cuff size:   regular  Vitals Entered By: Mervin Kung CMA Duncan Dull) (December 06, 2009 9:28 AM) CC: Room 5  Urine symptoms getting worse. Increased urinary frequency, back and abdominal pain x 2 weeks. Is Patient Diabetic? No Comments Pt states she picked up AZO yesterday but she is not having dysuria and feels her symptoms may be coming from something else. Allison Riley CMA Duncan Dull)  December 06, 2009 9:35 AM    Primary Care Provider:  Dondra Spry DO  CC:  Room 5  Urine symptoms getting worse. Increased urinary frequency and back and abdominal pain x 2 weeks.Marland Kitchen  History of Present Illness: Allison Riley a 65 year old female who presents today with complaint of ongoing lower abdominal soreness- pain is sharp at times.  + low back cramping pain.  Pain is not worsened or relieved by anything.  + urinary frequency.  Notes urine has been orange since starting Azo.  Denies fever.   She continues the vesicare.  BM's-chronic constipation (at baseline).    Notes that she has + history of urinary frequency- but worse recently.  Denies any vaginal discharge- s/p TAH BSO.  Unable to work due to "not being able to stay out of the bathroom." She expresses frustration with her ongoing symptoms and wants to know what is causing her symptoms.   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 Lungs are clear to auscultation, no crackles or wheezes. Heart:  Normal rate and regular rhythm. S1 and S2 normal without gallop, murmur, click, rub or other extra sounds. Abdomen:  Bowel sounds positive,abdomen soft and non-tender without masses, organomegaly or hernias noted. Msk:  Negative CVAT   Impression & Recommendations:  Problem # 1:  OVERACTIVE BLADDER (ICD-596.51) Last week UA was + for blood and nitrates.  Pt treated with Cipro, culture was ultimately negative.   Sample provided today was orange due to Azo.  Patient was sent for CT abdomen and pelvis which was negative  for kidney stones or pyelonephritis.   No acute abdominal pathology was noted.  Pt is afebrile and exam is unremarkable.  Will plan to increase her vesicare from 5mg  to 10mg  and treat empirically with ceftin.  Will reculture urine.  Recommended ibuprofen as needed back pain which may be musculoskeletal in nature.  Pt is requesting referral to urology at this time.  Will initiate referral.     Orders: Urology Referral (Urology)  Medications Added to Medication List This Visit: 1)  Vesicare 10 Mg Tabs (Solifenacin succinate) .... One tablet by mouth daily 2)  Ceftin 500 Mg Tabs (Cefuroxime axetil) .... One tablet by mouth twice daily x 7 days  Complete Medication List: 1)  Vesicare 10 Mg Tabs (Solifenacin succinate) .... One tablet by mouth daily 2)  Vivelle-dot 0.1 Mg/24hr Pttw (Estradiol) .... Apply 1 patch to skin twice a week 3)  Lorazepam 1 Mg Tabs (Lorazepam) .... One by mouth at bedtime as needed 4)  Cymbalta 60 Mg Cpep (Duloxetine hcl) .... One by mouth once daily 5)  Vigamox 0.5 % Soln (Moxifloxacin hcl) .Marland Kitchen.. 1 drop three times a day to both eyes 6)  Bromday 0.09 % Soln (Bromfenac sodium) .Marland Kitchen.. 1 drop three times a day  in both eyes. 7)  Durezol 0.05 % Emul (Difluprednate) .Marland Kitchen.. 1 drop three times a day in both eyes. 8)  Azo-standard 95 Mg Tabs (Phenazopyridine hcl) .... 2 tabs by mouth three times a day as needed for bladder pain. 9)  Ceftin 500 Mg Tabs (Cefuroxime axetil) .... One tablet by mouth twice daily x 7 days  Other Orders: Misc. Referral (Misc. Ref) T-Culture, Urine ([id number]) UA Dipstick w/o Micro (manual) (86578) Specimen Handling (99000)  Patient Instructions: 1)  Please complete your CT downstairs today.   2)  We will call you with the results downstairs.   Prescriptions: CEFTIN 500 MG TABS (CEFUROXIME AXETIL) one tablet by mouth twice daily x 7 days  #14 x 0   Entered and Authorized by:   Lemont Fillers FNP   Signed by:   Lemont Fillers FNP on 12/06/2009   Method used:   Electronically to        [facility]. [phone]* (retail)       [address]       [place], [place]  [address]       Ph: [phone]       Fax: [phone]   RxID:   [id number] VESICARE 10 MG TABS (SOLIFENACIN SUCCINATE) one tablet by mouth daily  #30 x 1   Entered and Authorized by:   Lemont Fillers FNP   Signed by:   Lemont Fillers FNP on 12/06/2009   Method used:   Electronically to        [facility] [phone]* (retail)       [address]       [place], [place]  [address]       Ph: [phone]       Fax: [phone]   RxID:   [id number]   Current Allergies (reviewed today): No known allergies

## 2010-05-09 NOTE — Medication Information (Signed)
Summary: Approval for Lexapro/[facility]  Approval for Lexapro/[facility]   Imported By: Lanelle Bal 06/02/2009 10:20:00  _____________________________________________________________________  External Attachment:    Type:   Image     Comment:   External Document

## 2010-05-09 NOTE — Progress Notes (Signed)
Summary: Status Update  Phone Note Call from Patient Call back at Home Phone [phone]   Caller: Patient Summary of Call: Patient called and left voice message on 04/12/2008 @  2:05 pm requesting a return phone call with no details provided. Call was returned to patient , she state she was not sure if someone from the office called to check on her, but she stated she is doing fine.  Initial call taken by: Glendell Docker CMA,  April 13, 2009 5:20 PM

## 2010-05-25 ENCOUNTER — Telehealth: Payer: Self-pay | Admitting: Internal Medicine

## 2010-05-31 NOTE — Assessment & Plan Note (Signed)
Summary: sinus /st/hea   Vital Signs:  Patient profile:   65 year old female Menstrual status:  hysterectomy Height:      65.5 inches Weight:      131.25 pounds BMI:     21.59 O2 Sat:      100 % on Room air Temp:     98.6 degrees F oral Pulse rate:   71 / minute Resp:     18 per minute BP sitting:   120 / 76  (right arm) Cuff size:   regular  Vitals Entered By: Glendell Docker CMA (May 08, 2010 4:26 PM)  O2 Flow:  Room air  CC: Sinus infection Is Patient Diabetic? No Pain Assessment Patient in pain? no        Primary Care Provider:  Dondra Spry DO  CC:  Sinus infection.  History of Present Illness: c/o sinus drainage yellow in color, fever last week, headaches. taken otc mucinex with no little relief,  anxiety / depression (adj d/o) - stable  Preventive Screening-Counseling & Management  Alcohol-Tobacco     Smoking Status: never  Allergies (verified): No Known Drug Allergies  Past History:  Past Medical History: Allergic rhinitis Asthma  Colonic polyps, hx of   Depression    Hyperlipidemia    Past Surgical History: Hysterectomy Tumor behind ear (8413'K)   Left foot surgery - Bunion  08/2008        Family History: Father deceased age 38 - cancer, died of GSW Mother deceased age 4 - mother died of suicide Younger brother - Clinical research associate, addicted to prescription drugs?        Social History: Separated  Never Smoked  Alcohol use-yes (socially)   Occupation:  Cytogeneticist of undergraduate admissions      Physical Exam  General:  alert, well-developed, and well-nourished.   Ears:  R ear normal and L ear normal.   Nose:  mucosal erythema and mucosal edema.   Mouth:  pharyngeal erythema.   Lungs:  normal respiratory effort and normal breath sounds.   Heart:  normal rate, regular rhythm, and no gallop.     Impression & Recommendations:  Problem # 1:  RHINOSINUSITIS, ACUTE (ICD-461.8)  The following medications were removed from the  medication list:    Cefuroxime Axetil 500 Mg Tabs (Cefuroxime axetil) ..... One by mouth two times a day    Nasonex 50 Mcg/act Susp (Mometasone furoate) .Marland Kitchen... 2 sprays each nostril qd Her updated medication list for this problem includes:    Amoxicillin-pot Clavulanate 500-125 Mg Tabs (Amoxicillin-pot clavulanate) ..... One by mouth two times a day  Instructed on treatment. Call if symptoms persist or worsen.   Problem # 2:  DEPRESSION (ICD-311) Assessment: Unchanged  Her updated medication list for this problem includes:    Lorazepam 1 Mg Tabs (Lorazepam) ..... One by mouth at bedtime as needed    Cymbalta 60 Mg Cpep (Duloxetine hcl) ..... One by mouth once daily  Problem # 3:  POSTMENOPAUSAL STATUS (ICD-V49.81) taper off HRT  Complete Medication List: 1)  Vesicare 10 Mg Tabs (Solifenacin succinate) .... One tablet by mouth daily 2)  Vivelle-dot 0.1 Mg/24hr Pttw (Estradiol) .... Apply 1 patch to skin twice a week 3)  Lorazepam 1 Mg Tabs (Lorazepam) .... One by mouth at bedtime as needed 4)  Cymbalta 60 Mg Cpep (Duloxetine hcl) .... One by mouth once daily 5)  Amoxicillin-pot Clavulanate 500-125 Mg Tabs (Amoxicillin-pot clavulanate) .... One by mouth two times a day  Patient  Instructions: 1)  Call our office if your symptoms do not  improve or gets worse. Prescriptions: VESICARE 10 MG TABS (SOLIFENACIN SUCCINATE) one tablet by mouth daily  #90 x 1   Entered and Authorized by:   D. Thomos Lemons DO   Signed by:   D. Thomos Lemons DO on 05/08/2010   Method used:   Print then Give to Patient   RxID:   [id number] AMOXICILLIN-POT CLAVULANATE 500-125 MG TABS (AMOXICILLIN-POT CLAVULANATE) one by mouth two times a day  #20 x 0   Entered and Authorized by:   D. Thomos Lemons DO   Signed by:   D. Thomos Lemons DO on 05/08/2010   Method used:   Electronically to        [facility]. [phone]* (retail)       [address]       [place], [place]  [address]        Ph: [phone]       Fax: [phone]   RxID:   [id number] LORAZEPAM 1 MG TABS (LORAZEPAM) one by mouth at bedtime as needed  #30 x 2   Entered and Authorized by:   D. Thomos Lemons DO   Signed by:   D. Thomos Lemons DO on 05/08/2010   Method used:   Print then Give to Patient   RxID:   [id number] CYMBALTA 60 MG CPEP (DULOXETINE HCL) one by mouth once daily  #90 x 1   Entered and Authorized by:   D. Thomos Lemons DO   Signed by:   D. Thomos Lemons DO on 05/08/2010   Method used:   Print then Give to Patient   RxID:   [id number]    Orders Added: 1)  Est. Patient Level III [01093]   Immunization History:  Influenza Immunization History:    Influenza:  historical (02/23/2010)   Immunization History:  Influenza Immunization History:    Influenza:  Historical (02/23/2010)  Current Allergies (reviewed today): No known allergies

## 2010-05-31 NOTE — Progress Notes (Signed)
Summary: sinus infection coming back  Phone Note Call from Patient Call back at Home Phone [phone] [facility]   Call back at [phone]   Caller: Patient Call For: D. Thomos Lemons DO Summary of Call: pt called and said that she is has finished her med for the sinus infection and now she is having problems again with a sinus infection but has no time to come in. she wants to know if dr.Janisa Labus can call her in something? please assist. Initial call taken by: Elba Barman,  May 25, 2010 4:49 PM  Follow-up for Phone Call        call returned to patient at [phone],  she states she has has a productive and dry  cough and congestion, fatigue. She states she has alot going on at work, and did not want her symptoms to worsen. She states has been using the rx cough syrup with some relief, but would like to know if Dr Artist Pais would provide a antibiotic Follow-up by: Glendell Docker CMA,  May 26, 2010 9:03 AM  Additional Follow-up for Phone Call Additional follow up Details #1::        see rx for ceftin if persistent symptoms, pt needs CXR and f/u OV Additional Follow-up by: D. Thomos Lemons DO,  May 26, 2010 10:13 AM    Additional Follow-up for Phone Call Additional follow up Details #2::    call returned to patient at [phone], no answer. A detailed voice message was left informing patient of rx and Dr Artist Pais instructions. Message was left for patient to call back is any questions Follow-up by: Glendell Docker CMA,  May 26, 2010 10:43 AM  New/Updated Medications: CEFUROXIME AXETIL 500 MG TABS (CEFUROXIME AXETIL) one by mouth two times a day Prescriptions: CEFUROXIME AXETIL 500 MG TABS (CEFUROXIME AXETIL) one by mouth two times a day  #14 x 0   Entered and Authorized by:   D. Thomos Lemons DO   Signed by:   D. Thomos Lemons DO on 05/26/2010   Method used:   Electronically to        [facility] [phone]* (retail)       [address]       [place],  [place]  [address]       Ph: [phone]       Fax: [phone]   RxID:   [id number]

## 2010-06-27 ENCOUNTER — Encounter: Payer: Self-pay | Admitting: Internal Medicine

## 2010-06-27 ENCOUNTER — Ambulatory Visit (INDEPENDENT_AMBULATORY_CARE_PROVIDER_SITE_OTHER): Payer: BC Managed Care – PPO | Admitting: Internal Medicine

## 2010-06-27 ENCOUNTER — Ambulatory Visit (HOSPITAL_BASED_OUTPATIENT_CLINIC_OR_DEPARTMENT_OTHER)
Admission: RE | Admit: 2010-06-27 | Discharge: 2010-06-27 | Disposition: A | Discharge status: Home / Self Care | Payer: BC Managed Care – PPO | Source: Ambulatory Visit | Attending: Internal Medicine | Admitting: Internal Medicine

## 2010-06-27 VITALS — BP 100/70 | HR 65 | Temp 98.7°F | Resp 18 | Ht 65.5 in | Wt 132.0 lb

## 2010-06-27 DIAGNOSIS — R059 Cough, unspecified: Secondary | ICD-10-CM | POA: Insufficient documentation

## 2010-06-27 DIAGNOSIS — R05 Cough: Secondary | ICD-10-CM | POA: Insufficient documentation

## 2010-06-27 MED ORDER — BUDESONIDE-FORMOTEROL FUMARATE 80-4.5 MCG/ACT IN AERO: 2.0000 | INHALATION_SPRAY | Freq: Two times a day (BID) | RESPIRATORY_TRACT | Status: AC

## 2010-06-27 NOTE — Assessment & Plan Note (Signed)
65 y/o with persistent cough after URI in Feb CXR is normal I suspect cough from mild tracheobronchitits Pt declines use of prednisone Sample of symbicort provided She has hx of chronic allergic rhinitis Pt advised to restart fexofenadine daily  Pt advised to call office if persistent or worsening symptoms

## 2010-06-27 NOTE — Progress Notes (Signed)
  Subjective:    Patient ID: Allison Riley, female    DOB: 1945-09-16, 65 y.o.   MRN: [id number]  HPI  Pt seen late January for possible sinus infection She was treated with ceftin Symptoms not improved She notes wheezing and cough (scant sputum) Dyspnea with exercise  Past Medical History  Diagnosis Date  . Allergy   . Asthma   . Depression   . Hyperlipidemia   . Colonic polyp    History   Social History  . Marital Status: Divorced    Spouse Name: N/A    Number of Children: N/A  . Years of Education: N/A   Occupational History  . Not on file.   Social History Main Topics  . Smoking status: Never Smoker   . Smokeless tobacco: Not on file  . Alcohol Use: Yes  . Drug Use:   . Sexually Active:    Other Topics Concern  . Not on file   Social History Narrative  . No narrative on file   Review of Systems  Constitutional: Negative for fever and chills.  HENT: Positive for congestion and postnasal drip. Negative for ear pain.   Respiratory: Positive for cough and wheezing.        Objective:   Physical Exam  Constitutional: She appears well-developed. No distress.  HENT:  Right Ear: External ear normal.  Left Ear: External ear normal.  Mouth/Throat: Oropharynx is clear and moist.       Post nasal gtt  Eyes: Conjunctivae are normal.  Neck: Normal range of motion. Neck supple.  Cardiovascular: Normal rate, regular rhythm and normal heart sounds.   Pulmonary/Chest: Effort normal and breath sounds normal.       Decreased breath sounds left base  Skin: She is not diaphoretic.  Psychiatric: She has a normal mood and affect.          Assessment & Plan:

## 2010-06-27 NOTE — Patient Instructions (Addendum)
Please call our office if your cough gets worse.

## 2010-07-27 ENCOUNTER — Other Ambulatory Visit: Payer: Self-pay | Admitting: *Deleted

## 2010-07-27 MED ORDER — DULOXETINE HCL 60 MG PO CPEP: 60.0000 mg | ORAL_CAPSULE | Freq: Every day | ORAL | Status: DC

## 2010-07-27 NOTE — Telephone Encounter (Signed)
Patient called and left voice message requesting refill on Cymbalta to [facility] are [address] and [address]

## 2010-07-27 NOTE — Telephone Encounter (Signed)
Left detailed message on cell phone to call and clarify if she is getting med through [facility] as most recent refill was sent to them in January with 1 additional refill; should still have refill at [facility].

## 2010-07-27 NOTE — Telephone Encounter (Signed)
Pt returned my call stating that she is not using [facility] any longer and prefers refill be sent to [facility] for 90 day supply. Pt notified.

## 2010-08-25 NOTE — Op Note (Signed)
NAMEDABNEY, DEVER                ACCOUNT NO.:  [id number]   MEDICAL RECORD NO.:  [id number]          PATIENT TYPE:  AMB   LOCATION:  SDC                           FACILITY:  [facility]   PHYSICIAN:  Zelphia Cairo, MD    DATE OF BIRTH:  [birth year]   DATE OF PROCEDURE:  04/21/2006  DATE OF DISCHARGE:                               OPERATIVE REPORT   PREOPERATIVE DIAGNOSIS:  Vaginal cuff bleeding.   POSTOPERATIVE DIAGNOSIS:  Vaginal cuff bleeding.   PROCEDURE:  Repair of the vaginal cuff.   SURGEON:  Zelphia Cairo, MD   ANESTHESIA:  General.   ESTIMATED BLOOD LOSS:  Minimal.   COMPLICATIONS:  None.   SPECIMENS:  None.   CONDITION:  Stable, to recovery room.   PROCEDURE:  The patient presented to [facility] with a  complaint of acute onset of bright red vaginal bleeding.  She had  undergone LAVH with BSO, April 07, 2006.  Her postoperative recovery  had otherwise been uncomplicated until today.  She denies any recent  intercourse or unusual activity.  Upon evaluation in the MAU, 2 small  areas of bleeding were noted at the vaginal cuff.  I was unable to  cauterize these regions using silver nitrate and Monsel's.  Due to  inadequate lighting and need for local anesthesia, the patient was taken  to the operating room for repair.   The patient was taken to the operating room, where she was placed in  dorsal lithotomy position using Allen stirrups.  General anesthesia was  obtained without difficulty.  She was prepped and draped in a sterile  fashion and her bladder was emptied for 5 mL of clear urine.  A weighted  speculum was placed in the posterior vagina and a Deaver was placed in  the anterior vagina.  Two figure-of-eight sutures were placed at the  vaginal cuff using 0 Monocryl.  Hemostasis was assured.  The vagina was  packed and the patient was extubated and taken to the recovery room in  stable condition.  Sponge, lap, needle and instrument counts were  correct x2.      Zelphia Cairo, MD  Electronically Signed    GA/MEDQ  D:  04/21/2006  T:  04/22/2006  Job:  [id number]

## 2010-08-25 NOTE — Op Note (Signed)
Allison Riley, Allison Riley                ACCOUNT NO.:  [id number]   MEDICAL RECORD NO.:  [id number]          PATIENT TYPE:  AMB   LOCATION:  SDC                           [facility]ACILITY[facility]  [facility]   PHYSICIAN:  Freddy Finner, M.D.   DATE OF BIRTH:  Aug 08, 1945   DATE OF PROCEDURE:  03/28/2006  DATE OF DISCHARGE:                               OPERATIVE REPORT   PREOPERATIVE DIAGNOSES:  1. Complex left ovarian cyst.  2. Uterine fibroids.  3. Endometrial polyps.  4. Postmenopausal bleeding.   POSTOPERATIVE DIAGNOSES:  1. Complex left ovarian cyst.  2. Uterine fibroids.  3. Endometrial polyps.  4. Postmenopausal bleeding.  5. Intraoperative pathology evaluation of ovary identifying in-situ      parovarian benign cyst.   OPERATIVE PROCEDURE:  Laparoscopically-assisted vaginal hysterectomy,  bilateral salpingo-oophorectomy.   ANESTHESIA:  General endotracheal.   INTRAOPERATIVE COMPLICATIONS:  None.   ESTIMATED INTRAOPERATIVE BLOOD LOSS:  Less than or equal to 100 mL.   The patient is a 65 year old who had an episode of postmenopausal  bleeding and had sonohysterogram showing endometrial polyps.  In  addition, she had a complex what appeared to be left ovarian mass.  Initially the plan was to reevaluate the cyst in 6-8 weeks and plan  hysteroscopy, D&C and resection of polyps and possible laparoscopy for  resection of the ovarian cyst if it persisted.  Based on time and  logistics and concern about the presence of the cyst, the patient  elected to proceed with an earlier exam and on recent ultrasound in the  office approximately 2 days prior to surgery, the cyst was seen to be  enlarged from its previous exam about a month ago.  Based on this, we  elected to proceed with more definitive surgery as described above.   She was admitted on the morning of surgery.  She was given a bolus of  Ancef IV preoperatively.  She was placed on antibiotics and PAS hose.  She was brought to the operating room  and placed under adequate general  endotracheal anesthesia, placed in the dorsal lithotomy position using  the Athalia stirrup system.  Betadine prep of abdomen, perineum and vagina  was carried out using Betadine scrub, followed by Betadine solution.  The bladder was evacuated with sterile technique.  A Hulka tenaculum was  attached to the cervix under direct visualization.  Sterile drape were  applied.  Two small incisions were made, one at the umbilicus and one  just above the symphysis.  Through the upper incision an 11 mm nonbladed  disposable trocar was introduced while elevating the anterior abdominal  wall manually.  Direct inspection revealed adequate placement with no  evidence of injury on entry into the peritoneal cavity.  Systematic  examination of the pelvic and abdominal contents was carried out.  No  apparent abnormality was noted in the upper abdomen except for some  dense vascular adhesions binding the cecum to the right anterolateral  abdominal wall.  These were most consistent with being congenitally  present.  There was no evidence of inflammatory change at this location.  The  appendix was visualized and was normal.  Pelvic contents were as  noted in the postoperative diagnoses.  Peritoneal washings were  initially taken on entry into the abdomen.  These were later discarded  after the negative intraoperative evaluation of the ovary.  A 5 mm  trocar was placed through the lower incision and through it a blunt  probe and later the Nezhat irrigation probe, and __________ grasping  forceps were used.  Using the grasping forceps, the left adnexa was  elevated.  With progressive bites using the tripolar device through the  operating channel of the laparoscope, the infundibulopelvic ligament,  the round ligament, the upper broad ligament were all sealed and divided  down to the level of just above the uterine artery.  The right side was  then treated in the essentially  identical manner.  The attention was  then turned vaginally.  The Hulka tenaculum was removed and the cervix  was grasped with a Christella Hartigan tenaculum.  Using Deaver retractors anteriorly  and laterally and a weighted posterior vaginal retractor posteriorly,  the cervix was grasped with a Jacobs tenaculum.  A colpotomy incision  was made while tenting the mucosa posterior to the cervix and peritoneal  cavity entered.  The cervix was released from the mucosa with a scalpel  in a circumferential incision around the cervix.  Using the Gyrus and a  Heaney-style clamp, the uterosacral pedicles and bladder pillars were  sealed and divided.  The bladder was further advanced off the cervix and  lower uterine segment.  Anterior peritoneum was entered.  The Gyrus  device was then used to seal and divide the cardinal ligament pedicles  and uterine artery pedicles.  The pedicles were taken just above the  arteries on each side.  This allowed complete release of the uterus,  tubes, and ovaries through the vaginal introitus.  The angles of the  vagina were anchored to the uterosacrals with mattress sutures of 0  Monocryl.  The posterior peritoneum was closed and the uterosacrals  plicated with interrupted 0 Monocryl.  The cuff was closed vertically  with figure-of-eights of 0 Monocryl.  A Foley catheter was placed.  Reinspection was carried out laparoscopically.  Hemostasis was complete.  Irrigation was carried out, irrigation aspirated from the abdomen.  All  the gas was allowed to escape from the abdomen and the instruments were  removed.  Marcaine 0.5% plain was then injected into the incision sites  for postoperative analgesia.  Incisions were closed with interrupted  subcuticular sutures of 3-0 Dexon.  Steri-Strips were also applied to  the lower incision.  A sterile dressing was applied at the umbilicus.  The patient was awakened and taken to recovery in good condition.     Freddy Finner, M.D.   Electronically Signed     WRN/MEDQ  D:  03/28/2006  T:  03/28/2006  Job:  [id number]

## 2010-08-25 NOTE — H&P (Signed)
Allison Riley, Allison Riley                ACCOUNT NO.:  [id number]   MEDICAL RECORD NO.:  [id number]          PATIENT TYPE:  AMB   LOCATION:  SDC                           [facility]:  [facility]   PHYSICIAN:  Freddy Finner, M.D.   DATE OF BIRTH:  Sep 28, 1945   DATE OF ADMISSION:  04/04/2006  DATE OF DISCHARGE:                              HISTORY & PHYSICAL   ADMITTING DIAGNOSES:  1. Post menopausal bleeding.  2. Endometrial polyps.  3. Enlarging right ovarian cystic mass.  4. Uterine myomata.   The patient is a 65 year old, white, married female, gravida 1 para 1,  who was first started on hormone replacement therapy in January 2006.  At that time she was having menopausal symptoms and Dexa bone density  test showing osteopenia.  She did not tolerate the progestin and for  that reason was changed to a different protocol with Vivelle 0.05 dot  twice weekly and Prometrium 200 mg at bedtime for 12 days out of every  __________  cycle.  On this protocol, she did reasonably well but  presented with dysfunction bleeding at times other than Prometrium  withdrawal and was evaluated in the office with sonohysterogram showing  small intramural fibroids, two endometrial polyps, and a complex left  ovarian cyst.  This was on March 13, 2006.  She has requested surgery  as soon as possible and options of therapy have been discussed with her  including laparoscopic-assisted vaginal hysterectomy bilateral salpingo-  oophorectomy.  The other option was laparoscopy with oophorectomy and  hysteroscopy D&C for resection of the polyps.  Considering all the  advantages of one versus the other, the patient has elected to proceed  with hysterectomy.  She is now admitted for that purpose.  She has  reviewed videos in the office of all the procedures including the  potential risks as well as the procedures including hemorrhage,  infection, and injury to other organs during the procedure.  She is now  admitted and  prepared to proceed with definitive surgery.   Her current review of systems is otherwise negative.  There are no  cardiopulmonary, GI, or GU complaints.   1. 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  2. She has had some mild situational depression and/or PMS like      symptoms in the past and is now currently on Zoloft 50 mg a day.  3. She is known to have venous varicosities.  4. 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   SHE HAS NO KNOWN DRUG ALLERGIES.   She does not use cigarettes or alcohol.   ADDITIONAL MEDICATIONS:  Other than those noted in the present illness  or above include Topamax and Allegra.   She has never had a blood transfusion.   FAMILY HISTORY:  Noncontributory.   PHYSICAL EXAMINATION:  HEENT:  Grossly within normal limits.  NECK:  Thyroid gland is not palpably enlarged.  VITAL SIGNS:  Blood pressure in the office  is 104/62.  CHEST:  Clear to auscultation throughout.  HEART:  Normal sinus rhythm without murmurs, rubs, or gallops.  BREAST:  Considered to be normal without palpable masses.  No nipple  discharge or skin change.  (Normal mammogram was obtained in March  2007).  ABDOMEN:  Soft and nontender without appreciable organomegaly or  palpable masses.  PELVIC:  External genitalia, vagina, and cervix are normal to  inspection.  Bimanual reveals the uterus to be slightly enlarged,  anterior in position.  The adnexal mass on the left side is not easily  palpable.  There is no palpable mass on the right side.  The rectum is  palpably normal and rectovaginal exam confirms the above findings.  EXTREMITIES:  Without cyanosis, clubbing, or edema.   ASSESSMENT:  Numerous gynecologic issues which will be adequately  resolved with laparoscopically-assisted vaginal hysterectomy and  bilateral salpingo-oophorectomy.  These problems as noted above are  uterine leiomyomata,  endometrial polyps, and an enlarging somewhat  complex left adnexal mass.   PLAN:  Proceed with surgery on the morning of admission.      Freddy Finner, M.D.  Electronically Signed     WRN/MEDQ  D:  03/27/2006  T:  03/27/2006  Job:  [id number]

## 2010-10-13 ENCOUNTER — Other Ambulatory Visit: Payer: Self-pay | Admitting: *Deleted

## 2010-10-13 MED ORDER — SOLIFENACIN SUCCINATE 10 MG PO TABS: 10.0000 mg | ORAL_TABLET | Freq: Every day | ORAL | Status: DC

## 2010-10-13 NOTE — Telephone Encounter (Signed)
Patient called and left voice message requesting a rx for  A 90 day supply of Vesicar 10 mg to [facility] at the corner of [address].   Rx refill sent to pharmacy

## 2010-11-02 ENCOUNTER — Encounter: Payer: Self-pay | Admitting: Internal Medicine

## 2010-11-02 ENCOUNTER — Ambulatory Visit (INDEPENDENT_AMBULATORY_CARE_PROVIDER_SITE_OTHER): Payer: BC Managed Care – PPO | Admitting: Internal Medicine

## 2010-11-02 DIAGNOSIS — J40 Bronchitis, not specified as acute or chronic: Secondary | ICD-10-CM

## 2010-11-02 MED ORDER — AMOXICILLIN-POT CLAVULANATE 875-125 MG PO TABS: 1.0000 | ORAL_TABLET | Freq: Two times a day (BID) | ORAL | Status: AC

## 2010-11-04 DIAGNOSIS — J40 Bronchitis, not specified as acute or chronic: Secondary | ICD-10-CM | POA: Insufficient documentation

## 2010-11-04 NOTE — Progress Notes (Signed)
  Subjective:    Patient ID: Allison Riley, female    DOB: [birth year], 65 y.o.   MRN: [id number]  HPI patient presents to clinic for evaluation of cough. Notes 5 day history of cough productive for yellow sputum, subjective fever and chills as well as chest congestion. Denies hemoptysis, shortness of breath or wheezing. No exacerbating or alleviating factors. No other complaints.  Reviewed past medical history, medications, and allergies.   Review of Systems see hpi     Objective:   Physical Exam  Nursing note and vitals reviewed. Constitutional: She appears well-developed and well-nourished.  HENT:  Head: Normocephalic and atraumatic.  Right Ear: Tympanic membrane, external ear and ear canal normal.  Left Ear: Tympanic membrane, external ear and ear canal normal.  Nose: Nose normal.  Mouth/Throat: Oropharynx is clear and moist. No oropharyngeal exudate.  Eyes: Conjunctivae are normal. Right eye exhibits no discharge. Left eye exhibits no discharge. No scleral icterus.  Neck: Neck supple.  Cardiovascular: Normal rate, regular rhythm and normal heart sounds.   Pulmonary/Chest: Effort normal and breath sounds normal. No respiratory distress. She has no wheezes. She has no rales.  Lymphadenopathy:    She has no cervical adenopathy.  Neurological: She is alert.  Skin: Skin is warm and dry. No rash noted.  Psychiatric: She has a normal mood and affect.          Assessment & Plan:   No problem-specific assessment & plan notes found for this encounter.

## 2010-11-04 NOTE — Assessment & Plan Note (Signed)
Attempt oral antibiotic course. Followup if no improvement or worsening.

## 2011-02-16 ENCOUNTER — Ambulatory Visit (INDEPENDENT_AMBULATORY_CARE_PROVIDER_SITE_OTHER): Payer: BC Managed Care – PPO | Admitting: Internal Medicine

## 2011-02-16 DIAGNOSIS — J209 Acute bronchitis, unspecified: Secondary | ICD-10-CM | POA: Insufficient documentation

## 2011-02-16 DIAGNOSIS — F329 Major depressive disorder, single episode, unspecified: Secondary | ICD-10-CM

## 2011-02-16 DIAGNOSIS — J019 Acute sinusitis, unspecified: Secondary | ICD-10-CM

## 2011-02-16 MED ORDER — CEFUROXIME AXETIL 500 MG PO TABS: 500.0000 mg | ORAL_TABLET | Freq: Two times a day (BID) | ORAL | Status: DC

## 2011-02-16 MED ORDER — DULOXETINE HCL 60 MG PO CPEP: 60.0000 mg | ORAL_CAPSULE | Freq: Every day | ORAL | Status: DC

## 2011-02-16 MED ORDER — LORAZEPAM 1 MG PO TABS: 1.0000 mg | ORAL_TABLET | Freq: Every day | ORAL | Status: DC | PRN

## 2011-02-16 NOTE — Patient Instructions (Signed)
Please call our office if your symptoms do not improve or gets worse.  

## 2011-02-16 NOTE — Progress Notes (Signed)
  Subjective:    Patient ID: Allison Riley, female    DOB: 1945-06-20, 65 y.o.   MRN: [id number]  URI  This is a new problem. The current episode started in the past 7 days. The problem has been gradually worsening. There has been no fever. Associated symptoms include congestion, coughing, sinus pain and sneezing.   patient complains of yellowish nasal drainage.    Review of Systems  HENT: Positive for congestion and sneezing.   Respiratory: Positive for cough.        Past Medical History  Diagnosis Date  . Allergy   . Asthma   . Depression   . Hyperlipidemia   . Colonic polyp     History   Social History  . Marital Status: Divorced    Spouse Name: N/A    Number of Children: N/A  . Years of Education: N/A   Occupational History  . Not on file.   Social History Main Topics  . Smoking status: Never Smoker   . Smokeless tobacco: Not on file  . Alcohol Use: Yes  . Drug Use:   . Sexually Active:    Other Topics Concern  . Not on file   Social History Narrative  . No narrative on file    Past Surgical History  Procedure Date  . Abdominal hysterectomy   . Foot surgery     left foot-bunion 08/2008  . 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 Cancer Father     deceased age 86  . Suicidality Mother     mother died of suicide age 13  . Drug abuse Brother     lawyer-addicted to prescription drugs    No Known Allergies  Current Outpatient Prescriptions on File Prior to Visit  Medication Sig Dispense Refill  . DULoxetine (CYMBALTA) 60 MG capsule Take 1 capsule (60 mg total) by mouth daily.  90 capsule  1  . estradiol (VIVELLE-DOT) 0.1 MG/24HR Place 1 patch onto the skin 2 (two) times a week.        . fexofenadine (ALLEGRA) 180 MG tablet Take 180 mg by mouth daily.        Marland Kitchen LORazepam (ATIVAN) 1 MG tablet Take 1 mg by mouth daily as needed.        . solifenacin (VESICARE) 10 MG tablet Take 1 tablet (10 mg  total) by mouth daily.  90 tablet  1        Objective:   Physical Exam   Constitutional: Appears well-developed and well-nourished. No distress.  Head: Normocephalic and atraumatic.  Ear:  Right and left ear normal.  TMs clear.  Hearing is grossly normal Mouth/Throat: Mild oropharyngeal erythema, no energy dates Eyes: Conjunctivae are normal. Pupils are equal, round, and reactive to light.  Neck: Normal range of motion. Neck supple. No thyromegaly present. No cervical adenopathy  Cardiovascular: Normal rate, regular rhythm and normal heart sounds.  Exam reveals no gallop and no friction rub.  No murmur heard. Pulmonary/Chest: Effort normal.  Slightly coarse breath sounds at bases, no wheezing Skin: Skin is warm and dry.  Psychiatric: Normal mood and affect. Behavior is normal.        Assessment & Plan:

## 2011-02-16 NOTE — Assessment & Plan Note (Signed)
65 year old female with signs and symptoms consistent with acute sinusitis. Treat with cefuroxime 500 mg twice daily x10 days. Patient advised to call office if symptoms persist or worsen.

## 2011-02-16 NOTE — Assessment & Plan Note (Signed)
Patient reports her symptoms are stable. Continue Cymbalta.

## 2011-02-26 ENCOUNTER — Telehealth: Payer: Self-pay | Admitting: Family Medicine

## 2011-02-26 ENCOUNTER — Ambulatory Visit (INDEPENDENT_AMBULATORY_CARE_PROVIDER_SITE_OTHER): Payer: BC Managed Care – PPO | Admitting: Internal Medicine

## 2011-02-26 ENCOUNTER — Encounter: Payer: Self-pay | Admitting: Internal Medicine

## 2011-02-26 VITALS — BP 114/78 | HR 71 | Temp 98.9°F | Ht 65.0 in | Wt 132.0 lb

## 2011-02-26 DIAGNOSIS — J209 Acute bronchitis, unspecified: Secondary | ICD-10-CM

## 2011-02-26 MED ORDER — PREDNISONE 10 MG PO TABS: ORAL_TABLET | ORAL | Status: DC

## 2011-02-26 MED ORDER — LEVOFLOXACIN 500 MG PO TABS: 500.0000 mg | ORAL_TABLET | Freq: Every day | ORAL | Status: AC

## 2011-02-26 NOTE — Progress Notes (Signed)
  Subjective:    Patient ID: Allison Riley, female    DOB: 1945/07/11, 65 y.o.   MRN: [id number]  HPI  65 year old white female for followup. Patient was seen approximately 10 days ago for upper respiratory infection. She was presumed to have sinusitis and treated with cefuroxime 500 mg twice daily. She reports her sinus discharge has improved but not resolved. Patient now complains of worsening wheezing especially over the last 48 hours. She has cough and mild shortness of breath.   Review of Systems Negative for fever,  negative for gastrointestinal symptoms  Past Medical History  Diagnosis Date  . Allergy   . Asthma   . Depression   . Hyperlipidemia   . Colonic polyp     History   Social History  . Marital Status: Divorced    Spouse Name: N/A    Number of Children: N/A  . Years of Education: N/A   Occupational History  . Not on file.   Social History Main Topics  . Smoking status: Never Smoker   . Smokeless tobacco: Not on file  . Alcohol Use: Yes  . Drug Use:   . Sexually Active:    Other Topics Concern  . Not on file   Social History Narrative  . No narrative on file    Past Surgical History  Procedure Date  . Abdominal hysterectomy   . Foot surgery     left foot-bunion 08/2008  . 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 Cancer Father     deceased age 24  . Suicidality Mother     mother died of suicide age 50  . Drug abuse Brother     lawyer-addicted to prescription drugs    No Known Allergies  Current Outpatient Prescriptions on File Prior to Visit  Medication Sig Dispense Refill  . DULoxetine (CYMBALTA) 60 MG capsule Take 1 capsule (60 mg total) by mouth daily.  90 capsule  1  . estradiol (VIVELLE-DOT) 0.1 MG/24HR Place 1 patch onto the skin 2 (two) times a week.        Marland Kitchen LORazepam (ATIVAN) 1 MG tablet Take 1 tablet (1 mg total) by mouth daily as needed.  30 tablet  5  . solifenacin  (VESICARE) 10 MG tablet Take 1 tablet (10 mg total) by mouth daily.  90 tablet  1    BP 114/78  Pulse 71  Temp(Src) 98.9 F (37.2 C) (Oral)  Ht 5\' 5"  (1.651 m)  Wt 132 lb (59.875 kg)  BMI 21.97 kg/m2  SpO2 96%       Objective:   Physical Exam  Constitutional: She appears well-developed and well-nourished.  HENT:  Head: Normocephalic and atraumatic.  Eyes: Conjunctivae are normal. Pupils are equal, round, and reactive to light.  Neck: Normal range of motion. Neck supple.  Cardiovascular: Normal rate, regular rhythm and normal heart sounds.   Pulmonary/Chest: Effort normal. She has wheezes.  Lymphadenopathy:    She has no cervical adenopathy.       Assessment & Plan:

## 2011-02-26 NOTE — Telephone Encounter (Signed)
Called pt. Appty made

## 2011-02-26 NOTE — Assessment & Plan Note (Signed)
65 year old female seen 10 days ago for possible sinusitis. She was treated with cefuroxime 500 mg twice a day but has persistent symptoms.  She has wheezing and mild shortness of breath consistent with acute bronchitis. Treat with Levaquin 500 mg daily x10 days and prednisone taper. Patient has albuterol metered-dose inhaler to use at home. Patient advised to call if symptoms persist or worsen.

## 2011-02-26 NOTE — Patient Instructions (Signed)
Patient advised to call office if symptoms persist or worsen.  

## 2011-02-26 NOTE — Telephone Encounter (Signed)
Pt was in to see Dr. Artist Pais on 11/9. Was given antibiotic, but is actually now worse. Can she come in some time today? 4:30? Or maybe right away at 1:15? Please let me know or call her.

## 2011-02-26 NOTE — Telephone Encounter (Signed)
Yes, tell her to come in at 1:00 pm

## 2011-03-26 ENCOUNTER — Ambulatory Visit (INDEPENDENT_AMBULATORY_CARE_PROVIDER_SITE_OTHER): Payer: BC Managed Care – PPO | Admitting: Internal Medicine

## 2011-03-26 ENCOUNTER — Encounter: Payer: Self-pay | Admitting: Internal Medicine

## 2011-03-26 VITALS — BP 120/80 | Temp 98.3°F | Wt 133.0 lb

## 2011-03-26 DIAGNOSIS — J329 Chronic sinusitis, unspecified: Secondary | ICD-10-CM

## 2011-03-26 MED ORDER — LEVOFLOXACIN 500 MG PO TABS: 500.0000 mg | ORAL_TABLET | Freq: Every day | ORAL | Status: AC

## 2011-03-26 NOTE — Patient Instructions (Signed)
Use Lloyd Huger Med Sinus Rinse over the counter as directed. Please call our office if your symptoms do not improve or gets worse.

## 2011-03-26 NOTE — Progress Notes (Signed)
  Subjective:    Patient ID: Allison Riley, female    DOB: [birth year], 65 y.o.   MRN: [id number]  Sinusitis This is a new problem. The current episode started in the past 7 days. The problem has been gradually worsening since onset. There has been no fever. The pain is moderate. Associated symptoms include congestion, coughing and sinus pressure. Pertinent negatives include no shortness of breath.   Her previous episode of bronchitis resolved with Levaquin and prednisone.   Review of Systems  HENT: Positive for congestion and sinus pressure.   Respiratory: Positive for cough. Negative for shortness of breath.    Past Medical History  Diagnosis Date  . Allergy   . Asthma   . Depression   . Hyperlipidemia   . Colonic polyp     History   Social History  . Marital Status: Divorced    Spouse Name: N/A    Number of Children: N/A  . Years of Education: N/A   Occupational History  . Not on file.   Social History Main Topics  . Smoking status: Never Smoker   . Smokeless tobacco: Not on file  . Alcohol Use: Yes  . Drug Use:   . Sexually Active:    Other Topics Concern  . Not on file   Social History Narrative  . No narrative on file    Past Surgical History  Procedure Date  . Abdominal hysterectomy   . Foot surgery     left foot-bunion 08/2008  . 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 Cancer Father     deceased age 75  . Suicidality Mother     mother died of suicide age 75  . Drug abuse Brother     lawyer-addicted to prescription drugs    No Known Allergies  Current Outpatient Prescriptions on File Prior to Visit  Medication Sig Dispense Refill  . DULoxetine (CYMBALTA) 60 MG capsule Take 1 capsule (60 mg total) by mouth daily.  90 capsule  1  . estradiol (VIVELLE-DOT) 0.1 MG/24HR Place 1 patch onto the skin 2 (two) times a week.        Marland Kitchen LORazepam (ATIVAN) 1 MG tablet Take 1 tablet (1 mg total) by  mouth daily as needed.  30 tablet  5  . solifenacin (VESICARE) 10 MG tablet Take 1 tablet (10 mg total) by mouth daily.  90 tablet  1    BP 120/80  Temp(Src) 98.3 F (36.8 C) (Oral)  Wt 133 lb (60.328 kg)       Objective:   Physical Exam  Constitutional: She appears well-developed and well-nourished.  HENT:  Head: Normocephalic and atraumatic.  Right Ear: External ear normal.  Left Ear: External ear normal.  Mouth/Throat: Oropharynx is clear and moist.       Nasal turbinates are boggy.  Purulent discharge left nares  Cardiovascular: Normal rate and regular rhythm.   Pulmonary/Chest: Effort normal and breath sounds normal.          Assessment & Plan:

## 2011-03-26 NOTE — Assessment & Plan Note (Signed)
65 year old with signs and symptoms of left maxillary sinusitis. Treat with Levaquin 500 mg once daily x10 days. Patient also advised to use over the counter saline sinus rinse.

## 2011-04-16 ENCOUNTER — Other Ambulatory Visit: Payer: Self-pay | Admitting: *Deleted

## 2011-04-16 MED ORDER — SOLIFENACIN SUCCINATE 10 MG PO TABS: 10.0000 mg | ORAL_TABLET | Freq: Every day | ORAL | Status: DC

## 2011-05-18 ENCOUNTER — Encounter: Payer: Self-pay | Admitting: Family Medicine

## 2011-05-18 ENCOUNTER — Ambulatory Visit (INDEPENDENT_AMBULATORY_CARE_PROVIDER_SITE_OTHER): Payer: BC Managed Care – PPO | Admitting: Family Medicine

## 2011-05-18 VITALS — BP 112/72 | HR 80 | Temp 98.8°F | Wt 133.0 lb

## 2011-05-18 DIAGNOSIS — S0083XA Contusion of other part of head, initial encounter: Secondary | ICD-10-CM

## 2011-05-18 DIAGNOSIS — S0003XA Contusion of scalp, initial encounter: Secondary | ICD-10-CM

## 2011-05-18 NOTE — Progress Notes (Signed)
  Subjective:    Patient ID: Allison Riley, female    DOB: Jul 21, 1945, 66 y.o.   MRN: [id number]  HPI Here to check an injury to her chin which occurred one week ago. While walking her 2 dogs she got tripped and fell forward on her face, bruising her right temple, right cheek, and the right chin. Most of these areas have improved a lot, but her chin is still puffy and bruised. She said it slowly got more swollen over several days and then started to go down. No trouble chewing.    Review of Systems  Constitutional: Negative.   HENT: Negative for trouble swallowing, neck pain, neck stiffness and dental problem.   Eyes: Negative.   Neurological: Negative.        Objective:   Physical Exam  Constitutional: She appears well-developed and well-nourished.  HENT:  Right Ear: External ear normal.  Left Ear: External ear normal.  Mouth/Throat: Oropharynx is clear and moist.       Healing abrasions to the right temple and zygoma. The right chin is ecchymotic, mildly swollen, and slightly tender. The TMJs are fine   Eyes: Conjunctivae and EOM are normal. Pupils are equal, round, and reactive to light.  Neck: Neck supple. No thyromegaly present.  Lymphadenopathy:    She has no cervical adenopathy.  Neurological: She is alert. No cranial nerve deficit.          Assessment & Plan:  This is simple bruising, and it sounds like she had a hematoma for a time. This should resolve over the next week or two. Recheck prn

## 2011-07-18 ENCOUNTER — Ambulatory Visit (INDEPENDENT_AMBULATORY_CARE_PROVIDER_SITE_OTHER): Payer: BC Managed Care – PPO | Admitting: Family

## 2011-07-18 ENCOUNTER — Encounter: Payer: Self-pay | Admitting: Family

## 2011-07-18 VITALS — BP 108/68 | Temp 98.8°F | Wt 131.0 lb

## 2011-07-18 DIAGNOSIS — R05 Cough: Secondary | ICD-10-CM

## 2011-07-18 DIAGNOSIS — J209 Acute bronchitis, unspecified: Secondary | ICD-10-CM

## 2011-07-18 MED ORDER — AZITHROMYCIN 250 MG PO TABS: ORAL_TABLET | ORAL | Status: AC

## 2011-07-18 MED ORDER — HYDROCODONE-HOMATROPINE 5-1.5 MG/5ML PO SYRP: 5.0000 mL | ORAL_SOLUTION | Freq: Three times a day (TID) | ORAL | Status: AC | PRN

## 2011-07-18 NOTE — Progress Notes (Signed)
Subjective:    Patient ID: Allison Riley, female    DOB: [birth year], 66 y.o.   MRN: [id number]  HPI Comments: C/o sinus drainage, productive cough with yellow tinged sputum, mild shortness or breath, low grade fever, and some wheezing x one week.  Cough is worse at night although does not affect sleep. Took OTC benadryl with no relief.      Review of Systems  Constitutional: Positive for fever. Negative for chills, diaphoresis, activity change, appetite change, fatigue and unexpected weight change.  HENT: Positive for congestion. Negative for hearing loss, ear pain, sore throat, facial swelling, rhinorrhea, sneezing, drooling, mouth sores, trouble swallowing, neck stiffness, dental problem, voice change, sinus pressure, tinnitus and ear discharge.   Eyes: Negative.   Respiratory: Positive for cough and shortness of breath. Negative for apnea, choking, chest tightness and stridor.   Cardiovascular: Negative.   Skin: Negative.    Past Medical History  Diagnosis Date  . Allergy   . Asthma   . Depression   . Hyperlipidemia   . Colonic polyp     History   Social History  . Marital Status: Divorced    Spouse Name: N/A    Number of Children: N/A  . Years of Education: N/A   Occupational History  . Not on file.   Social History Main Topics  . Smoking status: Never Smoker   . Smokeless tobacco: Never Used  . Alcohol Use: 2.5 oz/week    5 drink(s) per week  . Drug Use: No  . Sexually Active: Not on file   Other Topics Concern  . Not on file   Social History Narrative  . No narrative on file    Past Surgical History  Procedure Date  . Abdominal hysterectomy   . Foot surgery     left foot-bunion 08/2008  . 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 Cancer Father     deceased age 70  . Suicidality Mother     mother died of suicide age 24  . Drug abuse Brother     lawyer-addicted to prescription drugs    No  Known Allergies  Current Outpatient Prescriptions on File Prior to Visit  Medication Sig Dispense Refill  . DULoxetine (CYMBALTA) 60 MG capsule Take 1 capsule (60 mg total) by mouth daily.  90 capsule  1  . estradiol (VIVELLE-DOT) 0.1 MG/24HR Place 1 patch onto the skin 2 (two) times a week.        . solifenacin (VESICARE) 10 MG tablet Take 1 tablet (10 mg total) by mouth daily.  90 tablet  1  . LORazepam (ATIVAN) 1 MG tablet Take 1 tablet (1 mg total) by mouth daily as needed.  30 tablet  5    BP 108/68  Temp(Src) 98.8 F (37.1 C) (Oral)  Wt 131 lb (59.421 kg)chart    Objective:   Physical Exam  Constitutional: She is oriented to person, place, and time. She appears well-developed and well-nourished. No distress.  HENT:  Right Ear: External ear normal.  Left Ear: External ear normal.  Nose: Nose normal.  Mouth/Throat: Oropharyngeal exudate present.  Eyes: Conjunctivae are normal. Right eye exhibits no discharge. Left eye exhibits no discharge.  Neck: Neck supple. No JVD present. No tracheal deviation present. No thyromegaly present.  Cardiovascular: Normal rate, regular rhythm, normal heart sounds and intact distal pulses.  Exam reveals no gallop and no friction  rub.   No murmur heard. Pulmonary/Chest: Effort normal and breath sounds normal. No stridor. No respiratory distress. She has no wheezes. She has no rales. She exhibits no tenderness.  Lymphadenopathy:    She has no cervical adenopathy.  Neurological: She is alert and oriented to person, place, and time.  Skin: Skin is warm and dry. She is not diaphoretic.          Assessment & Plan:  Assessment: Bronchitis, Cough  Plan: Hycodan,  z-pack. Rest and increase po fluids. Teaching handouts provided on diagnosis and medications. Opportunity for questions provided. Encouraged to RTC if s/s get worse

## 2011-07-18 NOTE — Patient Instructions (Signed)

## 2011-08-10 ENCOUNTER — Encounter: Payer: Self-pay | Admitting: Internal Medicine

## 2011-08-16 ENCOUNTER — Other Ambulatory Visit: Payer: Self-pay | Admitting: *Deleted

## 2011-08-16 MED ORDER — DULOXETINE HCL 60 MG PO CPEP: 60.0000 mg | ORAL_CAPSULE | Freq: Every day | ORAL | Status: DC

## 2012-01-11 ENCOUNTER — Telehealth: Payer: Self-pay | Admitting: Internal Medicine

## 2012-01-11 MED ORDER — SOLIFENACIN SUCCINATE 10 MG PO TABS: 10.0000 mg | ORAL_TABLET | Freq: Every day | ORAL | Status: DC

## 2012-01-11 NOTE — Telephone Encounter (Signed)
[facility] called req new script for pts solifenacin (VESICARE) 10 MG tablet.

## 2012-02-04 ENCOUNTER — Ambulatory Visit (INDEPENDENT_AMBULATORY_CARE_PROVIDER_SITE_OTHER): Payer: BC Managed Care – PPO | Admitting: Internal Medicine

## 2012-02-04 ENCOUNTER — Encounter: Payer: Self-pay | Admitting: Internal Medicine

## 2012-02-04 ENCOUNTER — Ambulatory Visit (INDEPENDENT_AMBULATORY_CARE_PROVIDER_SITE_OTHER)
Admission: RE | Admit: 2012-02-04 | Discharge: 2012-02-04 | Disposition: A | Discharge status: Home / Self Care | Payer: BC Managed Care – PPO | Source: Ambulatory Visit | Attending: Internal Medicine | Admitting: Internal Medicine

## 2012-02-04 VITALS — BP 120/70 | HR 80 | Temp 98.9°F | Wt 133.0 lb

## 2012-02-04 DIAGNOSIS — R05 Cough: Secondary | ICD-10-CM

## 2012-02-04 DIAGNOSIS — J45909 Unspecified asthma, uncomplicated: Secondary | ICD-10-CM

## 2012-02-04 MED ORDER — MOMETASONE FUROATE 50 MCG/ACT NA SUSP: 2.0000 | Freq: Every day | NASAL | Status: DC

## 2012-02-04 MED ORDER — AZITHROMYCIN 250 MG PO TABS: ORAL_TABLET | ORAL | Status: DC

## 2012-02-04 NOTE — Progress Notes (Signed)
  Subjective:    Patient ID: Allison Riley, female    DOB: 07-Jun-1945, 66 y.o.   MRN: [id number]  HPI  66 year old white female with history of mild asthma complains of possible exacerbation over last 2 months. In general,she has been feeling "rundown". Patient reports she's not sure whether her symptoms are triggered by exposure to allergen versus an upper respiratory infection a month ago. She does complain of mild wheezing and shortness of breath. Patient reports her symptoms are worse at night. She denies any symptoms of gastroesophageal reflux disease. She has intermittent sinus congestion.  Review of Systems Negative for fever or chills Intermittent nonproductive cough  Past Medical History  Diagnosis Date  . Allergy   . Asthma   . Depression   . Hyperlipidemia   . Colonic polyp     History   Social History  . Marital Status: Divorced    Spouse Name: N/A    Number of Children: N/A  . Years of Education: N/A   Occupational History  . Not on file.   Social History Main Topics  . Smoking status: Never Smoker   . Smokeless tobacco: Never Used  . Alcohol Use: 2.5 oz/week    5 drink(s) per week  . Drug Use: No  . Sexually Active: Not on file   Other Topics Concern  . Not on file   Social History Narrative  . No narrative on file    Past Surgical History  Procedure Date  . Abdominal hysterectomy   . Foot surgery     left foot-bunion 08/2008  . 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 Cancer Father     deceased age 54  . Suicidality Mother     mother died of suicide age 79  . Drug abuse Brother     lawyer-addicted to prescription drugs    No Known Allergies  Current Outpatient Prescriptions on File Prior to Visit  Medication Sig Dispense Refill  . DULoxetine (CYMBALTA) 60 MG capsule Take 1 capsule (60 mg total) by mouth daily.  90 capsule  1  . LORazepam (ATIVAN) 1 MG tablet Take 1 tablet (1 mg  total) by mouth daily as needed.  30 tablet  5  . solifenacin (VESICARE) 10 MG tablet Take 1 tablet (10 mg total) by mouth daily.  90 tablet  1  . mometasone (NASONEX) 50 MCG/ACT nasal spray Place 2 sprays into the nose daily.  17 g  5    BP 120/70  Pulse 80  Temp 98.9 F (37.2 C) (Oral)  Wt 133 lb (60.328 kg)  SpO2 95%       Objective:   Physical Exam  Constitutional: She is oriented to person, place, and time. She appears well-developed and well-nourished.  Cardiovascular: Normal rate, regular rhythm and normal heart sounds.   No murmur heard. Pulmonary/Chest: Effort normal and breath sounds normal. She has no wheezes. She has no rales.  Abdominal: Soft. Bowel sounds are normal. She exhibits no distension.  Neurological: She is alert and oriented to person, place, and time. No cranial nerve deficit.  Psychiatric: She has a normal mood and affect. Her behavior is normal.          Assessment & Plan:

## 2012-02-04 NOTE — Patient Instructions (Signed)
Please call our office if your symptoms do not improve or gets worse.  

## 2012-02-04 NOTE — Assessment & Plan Note (Signed)
Patient concerned she may be experiencing asthma flare. She has increased shortness of breath and complains of wheezing. She is completely clear on lung exam today. Her symptoms are worse at night when she is lying down. I suspect her symptoms are secondary to sinusitis/postnasal drip. Treat with azithromycin and Nasonex.  Patient advised to call office if symptoms persist or worsen.

## 2012-03-31 ENCOUNTER — Ambulatory Visit (INDEPENDENT_AMBULATORY_CARE_PROVIDER_SITE_OTHER): Payer: BC Managed Care – PPO | Admitting: Internal Medicine

## 2012-03-31 ENCOUNTER — Encounter: Payer: Self-pay | Admitting: Internal Medicine

## 2012-03-31 VITALS — BP 134/86 | HR 79 | Temp 98.6°F | Wt 132.0 lb

## 2012-03-31 DIAGNOSIS — J029 Acute pharyngitis, unspecified: Secondary | ICD-10-CM

## 2012-03-31 MED ORDER — VALACYCLOVIR HCL 1 G PO TABS: 1000.0000 mg | ORAL_TABLET | Freq: Three times a day (TID) | ORAL | Status: DC

## 2012-03-31 NOTE — Patient Instructions (Addendum)
This could be a viral  Infection that causes ulcers. Such ones as coxsackie virus enterovirus adenovirus and herpesvirus .    These need to run their course  And use pain med and gargles as for comfort .  can take valtrex in case herpes virus is involved and can make this better quicker .   Contact us if high fever concerns .  Your ear exam is normal.

## 2012-03-31 NOTE — Progress Notes (Signed)
Chief Complaint  Patient presents with  . Sore Throat    On rt side.  Ongoing for 4 days.  . Otalgia  . Cough    HPI: Patient comes in today for SDA for  new problem evaluation.  has unilateral sore throat. sx  Right and pain in ears.    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 Has no fever but feels achy and not well. Denies any chills unusual rashes difficulty with teeth she does have pain on swallowing on the right side. There is no major cough. ROS: See pertinent positives and negatives per HPI.  Past Medical History  Diagnosis Date  . Allergy   . Asthma   . Depression   . Hyperlipidemia   . Colonic polyp     Family History  Problem Relation Age of Onset  . Cancer Father     deceased age 15  . Suicidality Mother     mother died of suicide age 79  . Drug abuse Brother     lawyer-addicted to prescription drugs    History   Social History  . Marital Status: Divorced    Spouse Name: N/A    Number of Children: N/A  . Years of Education: N/A   Social History Main Topics  . Smoking status: Never Smoker   . Smokeless tobacco: Never Used  . Alcohol Use: 2.5 oz/week    5 drink(s) per week  . Drug Use: No  . Sexually Active: None   Other Topics Concern  . 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 DULoxetine (CYMBALTA) 60 MG capsule Take 1 capsule (60 mg total) by mouth daily.  90 capsule  1  . solifenacin (VESICARE) 10 MG tablet Take 1 tablet (10 mg total) by mouth daily.  90 tablet  1  . LORazepam (ATIVAN) 1 MG tablet Take 1 tablet (1 mg total) by mouth daily as needed.  30 tablet  5  . mometasone (NASONEX) 50 MCG/ACT nasal spray Place 2 sprays into the nose daily.  17 g  5  . valACYclovir (VALTREX) 1000 MG tablet Take 1 tablet (1,000 mg total) by mouth  3 (three) times daily.  21 tablet  0  . [DISCONTINUED] azithromycin (ZITHROMAX) 250 MG tablet Take 2 tablets on day one, then one tablet daily for 4 days  6 tablet  0    EXAM:  BP 134/86  Pulse 79  Temp 98.6 F (37 C)  Wt 132 lb (59.875 kg)  SpO2 98%  There is no height on file to calculate BMI.  GENERAL: vitals reviewed and listed above, alert, oriented, appears well hydrated and in no acute distress  HEENT: atraumatic, conjunctiva  clear, no obvious abnormalities on inspection of external nose and ears OP : Shows no edema the soft palate right upper shows a crop of red areas like early vesicles but no acute ulcers. NECK: no obvious masses on inspection palpation tenderness over the right a.c. jaw area but no adenopathy or rash. No bruits are noted.  LUNGS: clear to auscultation bilaterally, no wheezes, rales or rhonchi, good air movement  CV: HRRR, no clubbing cyanosis or  peripheral edema nl cap refill   MS: moves all extremities without noticeable focal  abnormality  PSYCH: pleasant  and cooperative, no obvious depression or anxiety  ASSESSMENT AND PLAN:  Discussed the following assessment and plan:  1. 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 Sore throat  POCT rapid strep A   Sx rx for now  Disc prevention of communicability measures -Patient advised to return or notify health care team  immediately if symptoms worsen or persist or new concerns arise.  Patient Instructions  This could be a viral  Infection that causes ulcers. Such ones as coxsackie virus enterovirus adenovirus and herpesvirus .    These need to run their course  And use pain med and gargles as for comfort .  can take valtrex in case herpes virus is involved and can make this better quicker .   Contact us if high fever concerns .  Your ear exam is normal.    Neta Mends. Monay Houlton M.D.

## 2012-04-10 ENCOUNTER — Other Ambulatory Visit: Payer: Self-pay | Admitting: Internal Medicine

## 2012-05-05 ENCOUNTER — Encounter: Payer: Self-pay | Admitting: Internal Medicine

## 2012-05-05 ENCOUNTER — Ambulatory Visit (INDEPENDENT_AMBULATORY_CARE_PROVIDER_SITE_OTHER): Payer: BC Managed Care – PPO | Admitting: Internal Medicine

## 2012-05-05 VITALS — BP 116/74 | HR 80 | Temp 98.7°F | Wt 133.0 lb

## 2012-05-05 DIAGNOSIS — J45901 Unspecified asthma with (acute) exacerbation: Secondary | ICD-10-CM

## 2012-05-05 DIAGNOSIS — R05 Cough: Secondary | ICD-10-CM

## 2012-05-05 LAB — POCT INFLUENZA A/B
Influenza A, POC: NEGATIVE
Influenza B, POC: NEGATIVE

## 2012-05-05 MED ORDER — DOXYCYCLINE HYCLATE 100 MG PO TABS: 100.0000 mg | ORAL_TABLET | Freq: Two times a day (BID) | ORAL | Status: DC

## 2012-05-05 MED ORDER — MOMETASONE FURO-FORMOTEROL FUM 200-5 MCG/ACT IN AERO: 2.0000 | INHALATION_SPRAY | Freq: Two times a day (BID) | RESPIRATORY_TRACT | Status: DC

## 2012-05-05 NOTE — Assessment & Plan Note (Signed)
Patient experiencing asthma exacerbation secondary to probable URI.  Treat with doxycycline 100 mg twice daily.  Start Dulera 200/5 - 2 puffs twice daily.  She reports 4-5 exacerbations in Fall, 2013.  Reassess in 6 weeks.  Consider continuing inhaled corticosteroid (QVar) to help reduce exacerbations.

## 2012-05-05 NOTE — Progress Notes (Signed)
Subjective:    Patient ID: Allison Riley, female    DOB: Oct 16, 1945, 67 y.o.   MRN: [id number]  HPI  67 year old white female with history of well-controlled asthma complains of cough, sinus drainage and wheezing. Her symptoms started 4-5 days ago. She reports attending a meeting when she sat next someone who had flu like symptoms. 2-3 days later patient started to experience similar symptoms that included coughing and sinus congestion. She reports chest tightness worse especially at night.  Review of Systems Negative for fever chills She has been feeling achy over Negative for shortness of breath  Past Medical History  Diagnosis Date  . Allergy   . Asthma   . Depression   . Hyperlipidemia   . Colonic polyp     History   Social History  . Marital Status: Divorced    Spouse Name: N/A    Number of Children: N/A  . Years of Education: N/A   Occupational History  . Not on file.   Social History Main Topics  . Smoking status: Never Smoker   . Smokeless tobacco: Never Used  . Alcohol Use: 2.5 oz/week    5 drink(s) per week  . Drug Use: No  . Sexually Active: Not on file   Other Topics Concern  . 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 Abdominal hysterectomy   . Foot surgery     left foot-bunion 08/2008  . 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 Cancer Father     deceased age 98  . Suicidality Mother     mother died of suicide age 45  . Drug abuse Brother     lawyer-addicted to prescription drugs    No Known Allergies  Current Outpatient Prescriptions on File Prior to Visit  Medication Sig Dispense Refill  . DULoxetine (CYMBALTA) 60 MG capsule Take 1 capsule (60 mg total) by mouth daily.  90 capsule  2  . LORazepam (ATIVAN) 1 MG tablet Take 1 tablet (1 mg total) by mouth daily as needed.  30 tablet  5  .  mometasone (NASONEX) 50 MCG/ACT nasal spray Place 2 sprays into the nose daily.  17 g  5  . solifenacin (VESICARE) 10 MG tablet Take 1 tablet (10 mg total) by mouth daily.  90 tablet  1  . valACYclovir (VALTREX) 1000 MG tablet Take 1 tablet (1,000 mg total) by mouth 3 (three) times daily.  21 tablet  0    BP 116/74  Pulse 80  Temp 98.7 F (37.1 C) (Oral)  Wt 133 lb (60.328 kg)  SpO2 97%       Objective:   Physical Exam  Constitutional: She is oriented to person, place, and time. She appears well-developed and well-nourished.  HENT:  Head: Normocephalic and atraumatic.  Right Ear: External ear normal.  Left Ear: External ear normal.       Mild oropharyngeal erythema  Neck: Neck supple.       No neck tenderness  Cardiovascular: Normal rate, regular rhythm and normal heart sounds.   Pulmonary/Chest: Effort normal.       Scattered wheezing bilaterally  Lymphadenopathy:    She has no cervical adenopathy.  Neurological: She is alert and oriented to person, place, and time. No cranial nerve deficit.  Skin: Skin  is warm and dry.          Assessment & Plan:

## 2012-05-15 ENCOUNTER — Telehealth: Payer: Self-pay | Admitting: Internal Medicine

## 2012-05-15 NOTE — Telephone Encounter (Signed)
Patient Information:  Caller Name: Allison Riley  Phone: [phone]  Patient: Allison Riley  Gender: Female  DOB: [birth year]  Age: 67 Years  PCP: Artist Pais Doe-Hyun Molly Maduro) (Adults only)  Office Follow Up:  Does the office need to follow up with this patient?: Yes  Instructions For The Office: Requesting another antibiotic and steriod nasal spray  RN Note:  Asthma sx better but still having some wheezing.  Symptoms  Reason For Call & Symptoms: Seen in office on 05/05/12 and prescribed Doxcylline for Bronchtis/Cough and still having symptoms. She is still coughing- worse at night and is cough productive for yellow sputum. Some wheezing on and off.  Slight facial pressure, headaches and yellow nasal discharge. Requesting a different antibiotic and a Nasal Spray be called into [facility] on [address]. Veterinary surgeon.  Reviewed Health History In EMR: Yes  Reviewed Medications In EMR: Yes  Reviewed Allergies In EMR: Yes  Reviewed Surgeries / Procedures: Yes  Date of Onset of Symptoms: 05/06/2012  Guideline(s) Used:  Cough  Disposition Per Guideline:   Go to Office Now  Reason For Disposition Reached:   Wheezing is present  Advice Given:  Reassurance  Coughing is the way that our lungs remove irritants and mucus. It helps protect our lungs from getting pneumonia.  You can get a dry hacking cough after a chest cold. Sometimes this type of cough can last 1-3 weeks, and be worse at night.  Here is some care advice that should help.  Coughing Spasms:  Drink warm fluids. Inhale warm mist (Reason: both relax the airway and loosen up the phlegm).  Suck on cough drops or hard candy to coat the irritated throat.  Prevent Dehydration:  Drink adequate liquids.  Call Back If:  Difficulty breathing  Cough lasts more than 3 weeks  Fever lasts > 3 days  You become worse.  Patient Refused Recommendation:  Patient Requests Prescription  Requesting another antibiotic and steroid nasal  spray

## 2012-05-15 NOTE — Telephone Encounter (Signed)
Change to cefuroxime 500mg  twice daily for 10 days.  # 20   No RF.  Also nasonex 2 sprays each nostril once daily  RF x3

## 2012-05-16 MED ORDER — CEFUROXIME AXETIL 500 MG PO TABS: 500.0000 mg | ORAL_TABLET | Freq: Two times a day (BID) | ORAL | Status: DC

## 2012-05-16 MED ORDER — MOMETASONE FUROATE 50 MCG/ACT NA SUSP: 2.0000 | Freq: Every day | NASAL | Status: DC

## 2012-05-16 NOTE — Telephone Encounter (Signed)
Left message for pt to call back  °

## 2012-05-16 NOTE — Telephone Encounter (Signed)
rx sent in electronically, pt aware 

## 2012-08-02 ENCOUNTER — Other Ambulatory Visit: Payer: Self-pay | Admitting: Internal Medicine

## 2012-08-04 ENCOUNTER — Telehealth: Payer: Self-pay | Admitting: Internal Medicine

## 2012-08-04 NOTE — Telephone Encounter (Signed)
rx was sent in electronically 

## 2012-08-04 NOTE — Telephone Encounter (Signed)
Pt states the pharmacy did NOT get her RX for VESICARE 10 MG tablet. Could we resend.? Pt is out.  Pharm:   [facility]

## 2012-09-17 IMAGING — CR DG CHEST 2V
2 series · 2 of 2 positions shown · non-contrast
Comparison: None.

CLINICAL DATA: Cough

CHEST - 2 VIEW

[w chest pa]
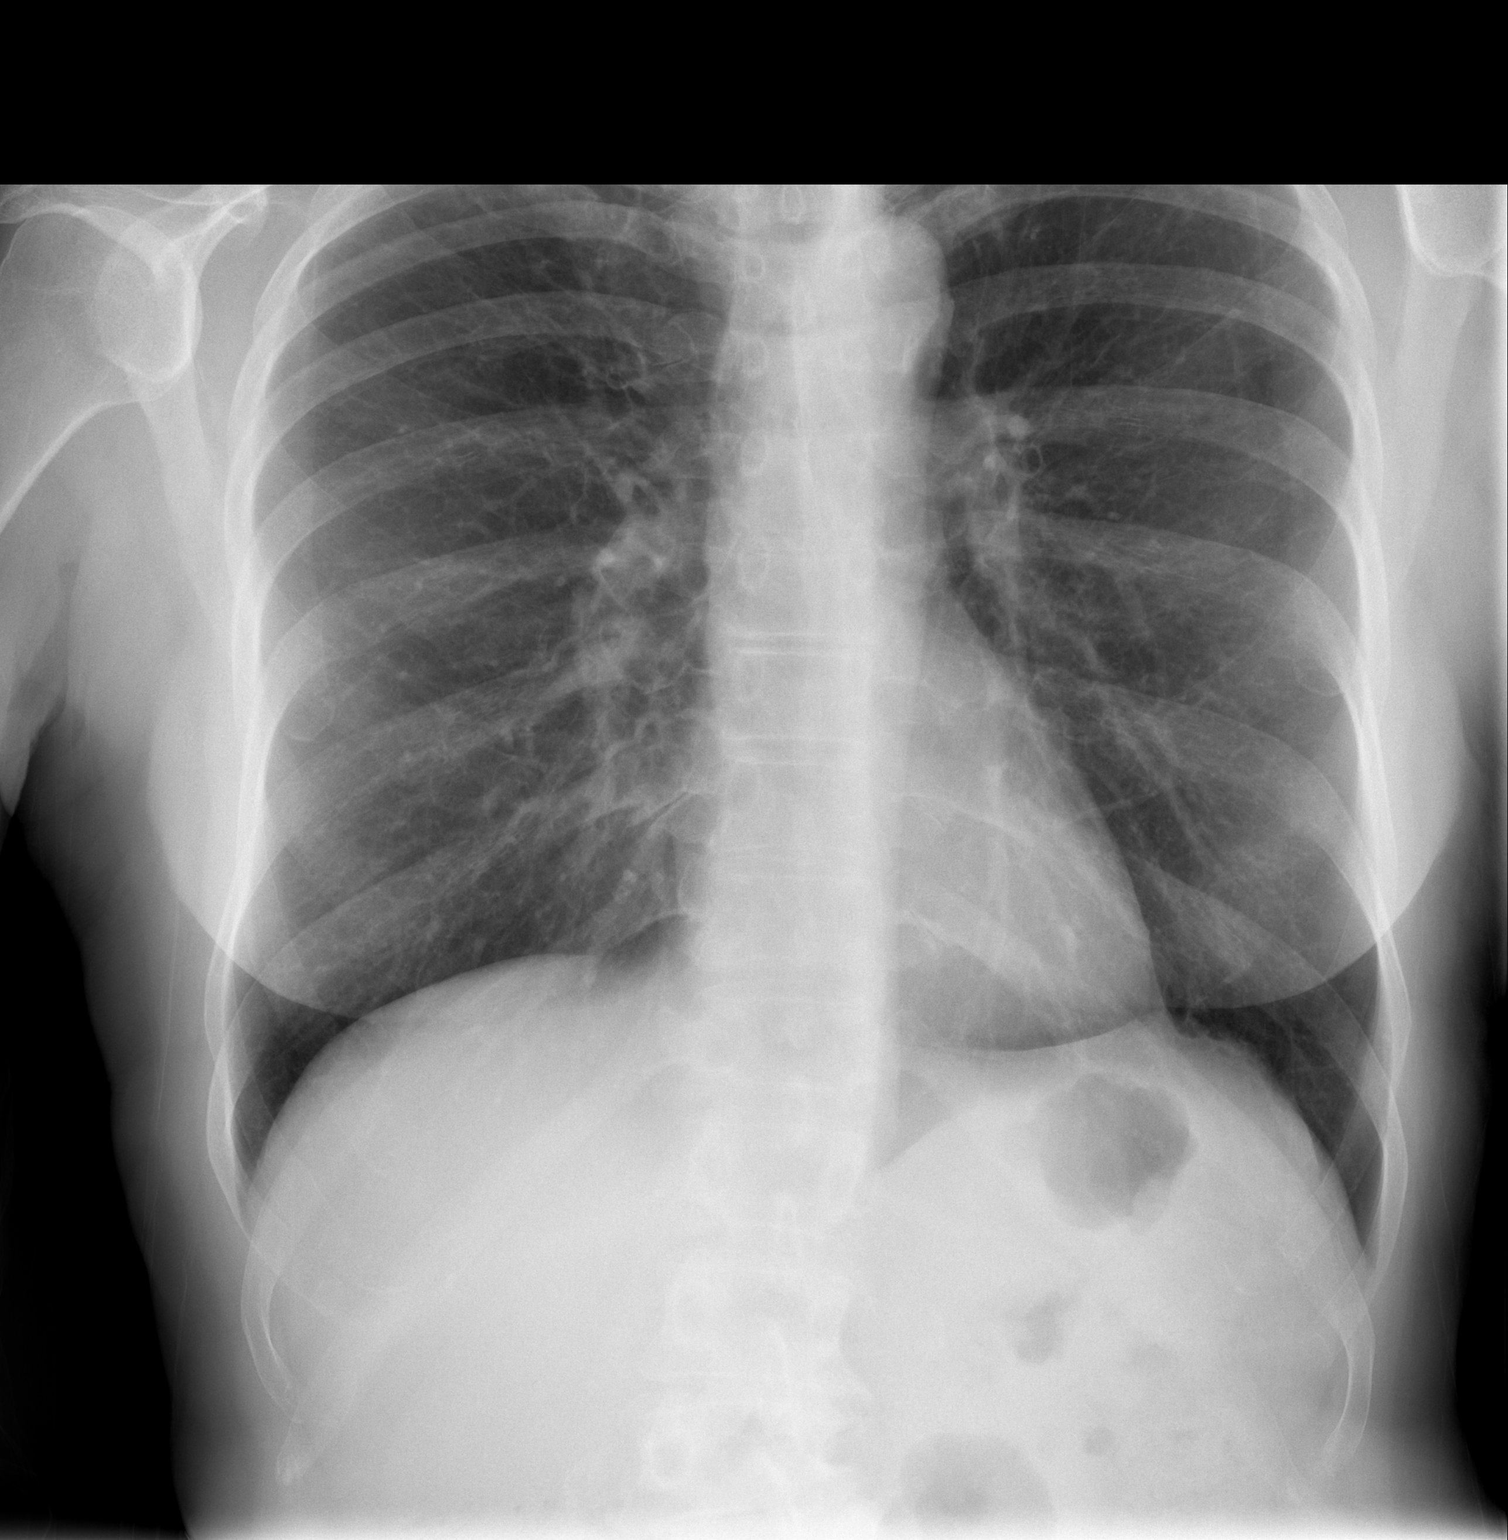

[w chest lat]
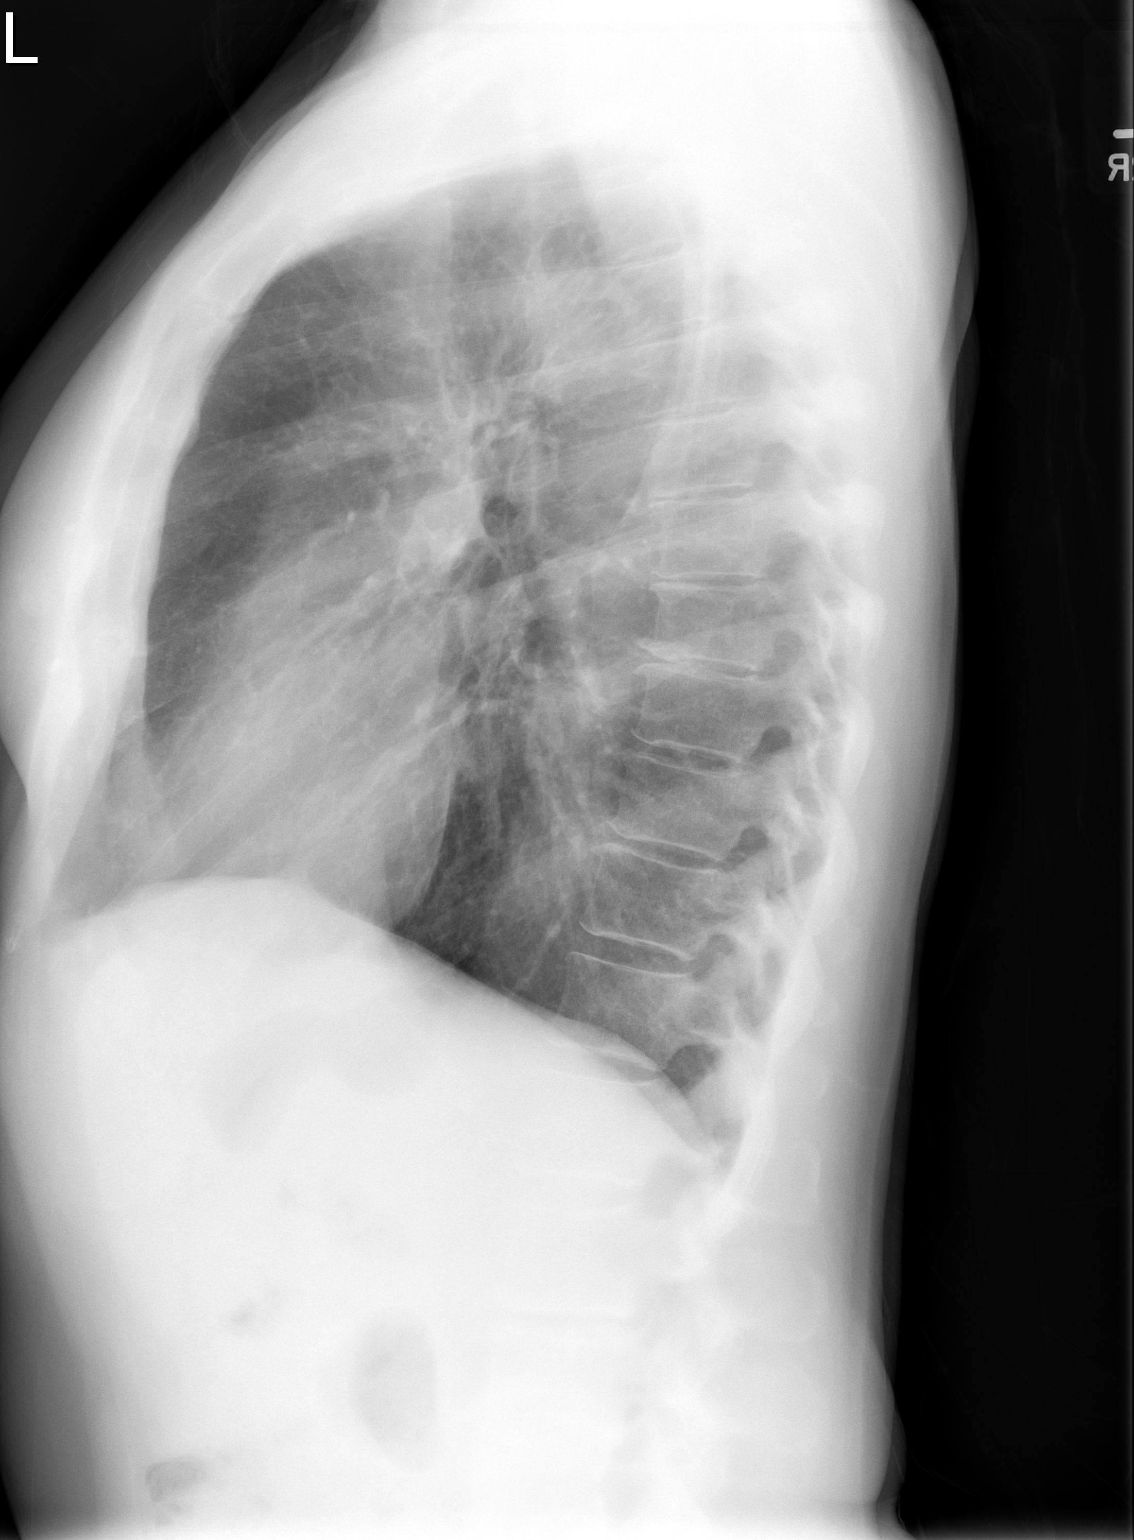

[2 of 2 positions shown; findings below may reference images not displayed]

FINDINGS: Cardiomediastinal silhouette is within normal limits.
Lungs are clear.  No pneumothorax or pleural effusion.  Mild
bronchitic changes have a chronic appearance.
IMPRESSION: No active cardiopulmonary disease.

## 2012-09-23 ENCOUNTER — Telehealth: Payer: Self-pay | Admitting: Internal Medicine

## 2012-09-23 MED ORDER — BUPROPION HCL ER (XL) 150 MG PO TB24: 150.0000 mg | ORAL_TABLET | Freq: Every day | ORAL | Status: DC

## 2012-09-23 MED ORDER — SOLIFENACIN SUCCINATE 10 MG PO TABS: ORAL_TABLET | ORAL | Status: DC

## 2012-09-23 NOTE — Telephone Encounter (Signed)
She can try switching to generic wellbutrin xl 150 mg one po qd # 30 RF x 1.  I suggest OV within 2 weeks of switching medication

## 2012-09-23 NOTE — Telephone Encounter (Signed)
Pt can not afford cymbalta due to cost. Pt would like another rx similar to cymbalta. Pt does  not want generic unless MD approves. Allison Riley [phone] Pt is aware MD out of office until 6-19

## 2012-09-23 NOTE — Telephone Encounter (Signed)
Patient is aware and rx sent to pharmacy.  Patient okay with trying generic for now.

## 2012-11-04 ENCOUNTER — Ambulatory Visit: Payer: BC Managed Care – PPO | Admitting: Internal Medicine

## 2012-12-02 ENCOUNTER — Other Ambulatory Visit: Payer: Self-pay | Admitting: Internal Medicine

## 2012-12-04 ENCOUNTER — Other Ambulatory Visit: Payer: Self-pay | Admitting: Internal Medicine

## 2013-01-12 ENCOUNTER — Ambulatory Visit (INDEPENDENT_AMBULATORY_CARE_PROVIDER_SITE_OTHER): Payer: BC Managed Care – PPO | Admitting: Internal Medicine

## 2013-01-12 ENCOUNTER — Encounter: Payer: Self-pay | Admitting: Internal Medicine

## 2013-01-12 VITALS — BP 142/80 | HR 76 | Temp 98.5°F | Ht 65.0 in | Wt 130.0 lb

## 2013-01-12 DIAGNOSIS — R079 Chest pain, unspecified: Secondary | ICD-10-CM

## 2013-01-12 DIAGNOSIS — R0789 Other chest pain: Secondary | ICD-10-CM

## 2013-01-12 MED ORDER — SERTRALINE HCL 100 MG PO TABS: 100.0000 mg | ORAL_TABLET | Freq: Every day | ORAL | Status: DC

## 2013-01-12 NOTE — Progress Notes (Signed)
Subjective:    Patient ID: Allison Riley, female    DOB: 12/31/1945, 67 y.o.   MRN: [id number]  HPI  67 year old white female with history of adjustment disorder with depressive symptoms, and allergic rhinitis for followup. Patient reports significant increase in life stressors especially at work environment. She is under tremendous stress. Ever since change in her work environment, patient reports atypical chest tightness. She wakes up with symptoms in the morning. Her symptoms are entirely nonexertional and triggered by work stress.    She denies any associated diaphoresis. She has history of asthma and has occasional dyspnea with exercise.  Since previous visit patient's Cymbalta was switched to Wellbutrin due to cost reasons. Patient has been on SSRIs in the past.  Review of Systems Negative for radiation of chest pain.  She denies increased belching or abdominal pain    Past Medical History  Diagnosis Date  . Allergy   . Asthma   . Depression   . Hyperlipidemia   . Colonic polyp     History   Social History  . Marital Status: Divorced    Spouse Name: N/A    Number of Children: N/A  . Years of Education: N/A   Occupational History  . Not on file.   Social History Main Topics  . Smoking status: Never Smoker   . Smokeless tobacco: Never Used  . Alcohol Use: 2.5 oz/week    5 drink(s) per week  . Drug Use: No  . Sexual Activity: Not on file   Other Topics Concern  . 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 Abdominal hysterectomy    . Foot surgery      left foot-bunion 08/2008  . 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 Cancer Father     deceased age 20  . Suicidality Mother     mother died of suicide age 31  . Drug abuse Brother     lawyer-addicted to prescription drugs    No Known  Allergies  Current Outpatient Prescriptions on File Prior to Visit  Medication Sig Dispense Refill  . mometasone (NASONEX) 50 MCG/ACT nasal spray Place 2 sprays into the nose daily.  17 g  5  . mometasone-formoterol (DULERA) 200-5 MCG/ACT AERO Inhale 2 puffs into the lungs 2 (two) times daily.  8.8 g  1  . solifenacin (VESICARE) 10 MG tablet TAKE 1 TABLET BY MOUTH DAILY  90 tablet  2  . LORazepam (ATIVAN) 1 MG tablet Take 1 tablet (1 mg total) by mouth daily as needed.  30 tablet  5  . [DISCONTINUED] solifenacin (VESICARE) 10 MG tablet Take 1 tablet (10 mg total) by mouth daily.  90 tablet  1   No current facility-administered medications on file prior to visit.    BP 142/80  Pulse 76  Temp(Src) 98.5 F (36.9 C) (Oral)  Ht 5\' 5"  (1.651 m)  Wt 130 lb (58.968 kg)  BMI 21.63 kg/m2   EKG shows normal sinus rhythm at 78 beats per minute.   Objective:   Physical Exam  Constitutional: She is oriented to person, place, and time. She appears well-developed and well-nourished.  HENT:  Head: Normocephalic and atraumatic.  Neck: Neck supple.  Cardiovascular: Normal rate, regular rhythm and normal heart sounds.  Pulmonary/Chest: Effort normal and breath sounds normal. She has no wheezes. She exhibits no tenderness.  Abdominal: Soft. Bowel sounds are normal. There is no tenderness.  Musculoskeletal: She exhibits no edema.  Lymphadenopathy:    She has no cervical adenopathy.  Neurological: She is alert and oriented to person, place, and time. No cranial nerve deficit.  Skin: Skin is warm and dry.  Psychiatric: She has a normal mood and affect. Her behavior is normal.          Assessment & Plan:

## 2013-01-12 NOTE — Patient Instructions (Signed)
You can try taking over the counter ranitidine 150 mg twice as needed Take 1/2 of sertraline 100 mg for 1 week, then take full tablet once daily Please contact our office if your symptoms do not improve or gets worse.

## 2013-01-12 NOTE — Assessment & Plan Note (Addendum)
67 year old white female with stress triggered chest tightness. Her symptoms are strictly nonexertional. Her EKG is unremarkable. Discontinue Wellbutrin. Switch to sertraline 100 mg once daily. Reassess in one month.  Patient advised to call office if symptoms persist or worsen.  Patient had CXR in 01/2012 - unremarkable

## 2013-02-03 ENCOUNTER — Ambulatory Visit (INDEPENDENT_AMBULATORY_CARE_PROVIDER_SITE_OTHER): Payer: BC Managed Care – PPO | Admitting: Family

## 2013-02-03 ENCOUNTER — Encounter: Payer: Self-pay | Admitting: Family

## 2013-02-03 VITALS — BP 118/70 | HR 68 | Wt 131.5 lb

## 2013-02-03 DIAGNOSIS — R319 Hematuria, unspecified: Secondary | ICD-10-CM

## 2013-02-03 DIAGNOSIS — R350 Frequency of micturition: Secondary | ICD-10-CM

## 2013-02-03 LAB — POCT URINALYSIS DIPSTICK
Glucose, UA: NEGATIVE
Ketones, UA: NEGATIVE
Nitrite, UA: NEGATIVE
Urobilinogen, UA: NEGATIVE
pH, UA: 7

## 2013-02-03 NOTE — Progress Notes (Signed)
Subjective:    Patient ID: Allison Riley, female    DOB: [birth year], 67 y.o.   MRN: [id number]  HPI  67 year old white female, nonsmoker, patient of Dr. Artist Pais in with complaints of urinary frequency x1 week and worsening today. She denies any burning with urination, blood in the urine, back pain, vaginal discharge or odor. She takes this year for an overactive bladder. Denies any bowel trouble no constipation or diarrhea.  Review of Systems  Constitutional: Negative.   Respiratory: Negative.   Cardiovascular: Negative.   Gastrointestinal: Negative.   Genitourinary: Positive for frequency. Negative for dysuria, menstrual problem and pelvic pain.  Musculoskeletal: Negative.   Skin: Negative.   Neurological: Negative.   Psychiatric/Behavioral: Negative.    Past Medical History  Diagnosis Date  . Allergy   . Asthma   . Depression   . Hyperlipidemia   . Colonic polyp     History   Social History  . Marital Status: Divorced    Spouse Name: N/A    Number of Children: N/A  . Years of Education: N/A   Occupational History  . Not on file.   Social History Main Topics  . Smoking status: Never Smoker   . Smokeless tobacco: Never Used  . Alcohol Use: 2.5 oz/week    5 drink(s) per week  . Drug Use: No  . Sexual Activity: Not on file   Other Topics Concern  . 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 Abdominal hysterectomy    . Foot surgery      left foot-bunion 08/2008  . 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 Cancer Father     deceased age 3  . Suicidality Mother     mother died of suicide age 8  . Drug abuse Brother     lawyer-addicted to prescription drugs    No Known Allergies  Current Outpatient Prescriptions on File Prior to Visit  Medication Sig Dispense Refill  . LORazepam (ATIVAN) 1 MG tablet  Take 1 tablet (1 mg total) by mouth daily as needed.  30 tablet  5  . mometasone (NASONEX) 50 MCG/ACT nasal spray Place 2 sprays into the nose daily.  17 g  5  . mometasone-formoterol (DULERA) 200-5 MCG/ACT AERO Inhale 2 puffs into the lungs 2 (two) times daily.  8.8 g  1  . sertraline (ZOLOFT) 100 MG tablet Take 1 tablet (100 mg total) by mouth daily.  90 tablet  1  . solifenacin (VESICARE) 10 MG tablet TAKE 1 TABLET BY MOUTH DAILY  90 tablet  2  . [DISCONTINUED] solifenacin (VESICARE) 10 MG tablet Take 1 tablet (10 mg total) by mouth daily.  90 tablet  1   No current facility-administered medications on file prior to visit.    BP 118/70  Pulse 68  Wt 131 lb 8 oz (59.648 kg)  BMI 21.88 kg/m2chart    Objective:   Physical Exam  Constitutional: She is oriented to person, place, and time. She appears well-developed and well-nourished.  Neck: Normal range of motion. Neck supple.  Cardiovascular: Normal rate, regular rhythm and normal heart sounds.   Pulmonary/Chest: Effort normal and breath sounds normal.  Abdominal: Soft. Bowel sounds are normal. There is no tenderness.  Neurological: She is alert and oriented to  person, place, and time.  Skin: Skin is warm and dry.  Psychiatric: She has a normal mood and affect.          Assessment & Plan:  Assessment: 1. Urinary frequency 2. Overactive bladder  Plan: Continue VESIcare. Urine culture sent. Will notify patient of results. If symptoms persist, will refer to urology.

## 2013-02-06 ENCOUNTER — Other Ambulatory Visit: Payer: Self-pay | Admitting: Family

## 2013-02-06 MED ORDER — CIPROFLOXACIN HCL 250 MG PO TABS: 250.0000 mg | ORAL_TABLET | Freq: Two times a day (BID) | ORAL | Status: DC

## 2013-02-07 LAB — URINE CULTURE

## 2013-02-16 ENCOUNTER — Telehealth: Payer: Self-pay | Admitting: Internal Medicine

## 2013-02-16 MED ORDER — SULFAMETHOXAZOLE-TRIMETHOPRIM 800-160 MG PO TABS: 1.0000 | ORAL_TABLET | Freq: Two times a day (BID) | ORAL | Status: DC

## 2013-02-16 NOTE — Telephone Encounter (Signed)
Pt is not feeling any better, has taken all of meds and states that her uti symptoms are returning, she also feels achy and feverish.  Pt thinks that the rx was not strong enough.  Can dosage be changed or can something else be called in for her? Please advise.

## 2013-02-16 NOTE — Telephone Encounter (Signed)
Please advise 

## 2013-02-16 NOTE — Telephone Encounter (Signed)
Pt aware and will abx up from pharmacy

## 2013-02-16 NOTE — Telephone Encounter (Signed)
Sent Bactrim 

## 2013-02-19 ENCOUNTER — Telehealth: Payer: Self-pay | Admitting: Internal Medicine

## 2013-02-19 DIAGNOSIS — N390 Urinary tract infection, site not specified: Secondary | ICD-10-CM

## 2013-02-19 NOTE — Telephone Encounter (Signed)
Pt had cipro 02/06/13 and Padonda switched it to [facility] and she states that the new antibiotic is not working.  I put referral in for urology.  Please advise if you want pt to do anything else. Pt is aware to go to the ER if she develops fever, nausea, vomiting

## 2013-02-19 NOTE — Telephone Encounter (Signed)
Patient Information:  Caller Name: Mariadelcarmen  Phone: [phone]  Patient: Markus Jarvis  Gender: Female  DOB: 1945-09-01  Age: 67 Years  PCP: Artist Pais Doe-Hyun Molly Maduro) (Adults only)  Office Follow Up:  Does the office need to follow up with this patient?: Yes  Instructions For The Office: Patient needs call back.  RN Note:  Patient thinks that she may have intersitial cystitis. She has not had any relief from taking the antibiotics prescribed. Please contact her with instructions of what she can take for pain and also if she needs a urologist referral.  Symptoms  Reason For Call & Symptoms: Reports she was diagnosed with a UTI, has frequency, urgency, radiating pain. Reports she is taking the 2nd type of antibiotic that was prescribed with no relief. Would like to know what she can take to get relief? Also she would like to know if she needs to see a urologist.  Reviewed Health History In EMR: Yes  Reviewed Medications In EMR: Yes  Reviewed Allergies In EMR: Yes  Reviewed Surgeries / Procedures: Yes  Date of Onset of Symptoms: 02/19/2013  Treatments Tried: Sulfa antibiotic that was prescribed  Treatments Tried Worked: No  Guideline(s) Used:  Urination Pain - Female  Disposition Per Guideline:   Go to Office Now  Reason For Disposition Reached:   Severe pain with urination  Advice Given:  N/A  Patient Will Follow Care Advice:  YES

## 2013-02-19 NOTE — Telephone Encounter (Signed)
Pt would like cindy to return her call today

## 2013-02-20 ENCOUNTER — Other Ambulatory Visit (INDEPENDENT_AMBULATORY_CARE_PROVIDER_SITE_OTHER): Payer: BC Managed Care – PPO

## 2013-02-20 DIAGNOSIS — N318 Other neuromuscular dysfunction of bladder: Secondary | ICD-10-CM

## 2013-02-20 DIAGNOSIS — N3281 Overactive bladder: Secondary | ICD-10-CM

## 2013-02-20 LAB — POCT URINALYSIS DIPSTICK
Glucose, UA: NEGATIVE
Ketones, UA: NEGATIVE
Nitrite, UA: NEGATIVE
Spec Grav, UA: 1.015
Urobilinogen, UA: 0.2

## 2013-02-20 MED ORDER — CIPROFLOXACIN HCL 500 MG PO TABS: 500.0000 mg | ORAL_TABLET | Freq: Two times a day (BID) | ORAL | Status: DC

## 2013-02-20 NOTE — Telephone Encounter (Signed)
Per Dr Artist Pais call in Cipro 500 mg bid x7 days, pt aware, rx sent in electronically

## 2013-02-20 NOTE — Telephone Encounter (Signed)
Pt would like urine result °

## 2013-03-03 ENCOUNTER — Other Ambulatory Visit: Payer: Self-pay | Admitting: Urology

## 2013-03-11 ENCOUNTER — Encounter (HOSPITAL_BASED_OUTPATIENT_CLINIC_OR_DEPARTMENT_OTHER): Payer: Self-pay | Admitting: *Deleted

## 2013-03-12 ENCOUNTER — Encounter (HOSPITAL_BASED_OUTPATIENT_CLINIC_OR_DEPARTMENT_OTHER): Payer: Self-pay | Admitting: *Deleted

## 2013-03-12 NOTE — Progress Notes (Signed)
NPO AFTER MN.  ARRIVE AT 1015.  NEEDS HG. 

## 2013-03-13 NOTE — H&P (Signed)
History of Present Illness Allison Riley presents today for further evaluation of some ongoing vaginal pelvic and bladder discomfort. She was seen a little over 3 years ago for similar complaints along with bladder overactivity. She has not been back for any follow-up. Over the years she has had intermittent episodes of pelvic and bladder discomfort but things have worsened recently. She tells me that approximately a month ago she began experiencing significant pelvic and vaginal discomfort. She has had some increase in urgency frequency and nocturia. She was seen at her primary care physician's office where urinalysis was unremarkable culture was positive for Escherichia coli. She's been treated with at least 3 courses of antibiotics without any significant improvement. She complains of generalized fatigue as well as pelvic discomfort.   Past Medical History Problems  1. History of Anxiety (300.00) 2. History of Arthritis (V13.4) 3. History of Asthma (493.90) 4. History of allergic rhinitis (V12.69) 5. History of depression (V11.8) 6. History of depression (V11.8) 7. History of hyperlipidemia (V12.29) 8. History of Polyp of sigmoid colon (211.3)  Surgical History Problems  1. History of Cataract Surgery 2. History of Ear Surgery 3. History of Foot Surgery 4. History of Hysterectomy 5. History of Oral Surgery Tooth Extraction 6. History of Sinus Surgery  Current Meds 1. Cipro 500 MG Oral Tablet (Ciprofloxacin HCl);  Therapy: (Recorded:20Nov2014) to Recorded 2. Minivelle 0.05 MG/24HR Transdermal Patch Twice Weekly;  Therapy: 21Apr2014 to Recorded 3. Sertraline HCl - 100 MG Oral Tablet;  Therapy: 06Oct2014 to Recorded 4. VESIcare 10 MG Oral Tablet;  Therapy: 30Aug2011 to Recorded  Allergies Medication  1. No Known Drug Allergies  Family History Problems  1. Family history of Cancer : Father 2. Family history of Cancer : Maternal Grandmother 3. Family history of Cancer : Paternal  Grandfather 4. Family history of Family Health Status - Mother's Age   76yrs, suicide 5. Family history of Family Health Status Number Of Children   1 daughter 74. Family history of Father Deceased At Age ____   51y/o, gunshot 7. Family history of Suicide Completion : Mother  Social History Problems    Alcohol Use   2-3 q wine   Caffeine Use   3c qd   Marital History - Divorced (V61.03)   Occupation:   college admin   Denied: History of Tobacco Use  Review of Systems Genitourinary, constitutional, skin, eye, otolaryngeal, hematologic/lymphatic, cardiovascular, pulmonary, endocrine, musculoskeletal, gastrointestinal, neurological and psychiatric system(s) were reviewed and pertinent findings if present are noted.  Genitourinary: urinary frequency, urinary urgency, nocturia, pelvic pain and suprapubic pain.  Gastrointestinal: constipation.  Constitutional: feeling tired (fatigue).  Musculoskeletal: back pain.  Psychiatric: depression.    Vitals Vital Signs [Data Includes: Last 1 Day]  Recorded: 20Nov2014 12:08PM  Height: 5 ft 5.5 in Weight: 128 lb  BMI Calculated: 20.98 BSA Calculated: 1.65 Blood Pressure: 103 / 77 Temperature: 98.1 F Heart Rate: 69  Physical Exam Constitutional: Well nourished and well developed . No acute distress.  ENT:. The ears and nose are normal in appearance.  Neck: The appearance of the neck is normal and no neck mass is present.  Pulmonary: No respiratory distress and normal respiratory rhythm and effort.  Cardiovascular: Heart rate and rhythm are normal . No peripheral edema.  Abdomen: The abdomen is soft and nontender. No masses are palpated. No CVA tenderness. No hernias are palpable. No hepatosplenomegaly noted.  Genitourinary:  Chaperone Present: katy.  Examination of the external genitalia shows normal female external genitalia and no lesions.  The urethra is tender, but normal in appearance. There is no urethral mass. Vaginal  exam demonstrates no abnormalities. The uterus is absent. The bladder is tender, but not distended. The anus is normal on inspection. The perineum is normal on inspection.  Lymphatics: The femoral and inguinal nodes are not enlarged or tender.  Skin: Normal skin turgor, no visible rash and no visible skin lesions.  Neuro/Psych:. Mood and affect are appropriate.    Results/Data Urine [Data Includes: Last 1 Day]   20Nov2014  COLOR YELLOW   APPEARANCE CLEAR   SPECIFIC GRAVITY 1.025   pH 5.5   GLUCOSE NEG mg/dL  BILIRUBIN NEG   KETONE NEG mg/dL  BLOOD TRACE   PROTEIN NEG mg/dL  UROBILINOGEN 0.2 mg/dL  NITRITE NEG   LEUKOCYTE ESTERASE NEG   SQUAMOUS EPITHELIAL/HPF RARE   WBC NONE SEEN WBC/hpf  RBC 0-2 RBC/hpf  BACTERIA NONE SEEN   CRYSTALS NONE SEEN   CASTS ND    Assessment Assessed  1. Urinary urgency (788.63) 2. Female pelvic pain (625.9) 3. Abdominal pain, suprapubic (789.09)  Plan Abdominal pain, suprapubic  1. Follow-up Weeks 6-8 Office  Follow-up  Status: Hold For - Date of Service  Requested for:  13Jan2015  Discussion/Summary Miss Peet is at least 1 month of fairly significant Pelvic discomfort along with some increase in bladder overactivity. On clinical exam she has quite a bit of bladder base tenderness. She did have a positive urine culture but a negative urinalysis which suggested that E coli was probably a colonization and not a true uropathogen. She has had multiple courses of antibiotics without improvement and I do not think her symptoms are secondary to ongoing bacterial cystitis. Her urinalysis today is clear. She may indeed have painful bladder syndrome/interstitial cystitis. She is currently on Vesicare for some bladder pressure. I provided her with some samples of My betriq  To see if perhaps more efficacious. We talked about the possibility of cystoscopy with hydraulic overdistention of the bladder. We discussed this is both potentially diagnostic as well  as therapeutic. We can also rule out other pathology which is unlikely. She is going to think about this and let me know she would like to proceed with this procedure. If not we will set up routine follow-up for her in about 6 weeks.

## 2013-03-16 ENCOUNTER — Ambulatory Visit (HOSPITAL_BASED_OUTPATIENT_CLINIC_OR_DEPARTMENT_OTHER)
Admission: RE | Admit: 2013-03-16 | Discharge: 2013-03-16 | Disposition: A | Discharge status: Home / Self Care | Payer: BC Managed Care – PPO | Source: Ambulatory Visit | Attending: Urology | Admitting: Urology

## 2013-03-16 ENCOUNTER — Encounter (HOSPITAL_BASED_OUTPATIENT_CLINIC_OR_DEPARTMENT_OTHER): Payer: BC Managed Care – PPO | Admitting: Anesthesiology

## 2013-03-16 ENCOUNTER — Encounter (HOSPITAL_BASED_OUTPATIENT_CLINIC_OR_DEPARTMENT_OTHER)
Admission: RE | Disposition: A | Discharge status: Home / Self Care | Payer: Self-pay | Source: Ambulatory Visit | Attending: Urology

## 2013-03-16 ENCOUNTER — Ambulatory Visit (HOSPITAL_BASED_OUTPATIENT_CLINIC_OR_DEPARTMENT_OTHER): Payer: BC Managed Care – PPO | Admitting: Anesthesiology

## 2013-03-16 ENCOUNTER — Encounter (HOSPITAL_BASED_OUTPATIENT_CLINIC_OR_DEPARTMENT_OTHER): Payer: Self-pay | Admitting: Anesthesiology

## 2013-03-16 DIAGNOSIS — Z90710 Acquired absence of both cervix and uterus: Secondary | ICD-10-CM | POA: Insufficient documentation

## 2013-03-16 DIAGNOSIS — R5381 Other malaise: Secondary | ICD-10-CM | POA: Insufficient documentation

## 2013-03-16 DIAGNOSIS — N318 Other neuromuscular dysfunction of bladder: Secondary | ICD-10-CM | POA: Insufficient documentation

## 2013-03-16 DIAGNOSIS — R351 Nocturia: Secondary | ICD-10-CM | POA: Insufficient documentation

## 2013-03-16 DIAGNOSIS — R3915 Urgency of urination: Secondary | ICD-10-CM | POA: Insufficient documentation

## 2013-03-16 DIAGNOSIS — E785 Hyperlipidemia, unspecified: Secondary | ICD-10-CM | POA: Insufficient documentation

## 2013-03-16 DIAGNOSIS — J45909 Unspecified asthma, uncomplicated: Secondary | ICD-10-CM | POA: Insufficient documentation

## 2013-03-16 DIAGNOSIS — N949 Unspecified condition associated with female genital organs and menstrual cycle: Secondary | ICD-10-CM | POA: Insufficient documentation

## 2013-03-16 HISTORY — DX: Urgency of urination: R39.15

## 2013-03-16 HISTORY — DX: Personal history of colon polyps, unspecified: Z86.0100

## 2013-03-16 HISTORY — PX: CYSTO WITH HYDRODISTENSION: SHX5453

## 2013-03-16 HISTORY — DX: Pelvic and perineal pain: R10.2

## 2013-03-16 HISTORY — DX: Dry eye syndrome of bilateral lacrimal glands: H04.123

## 2013-03-16 HISTORY — DX: Personal history of colonic polyps: Z86.010

## 2013-03-16 HISTORY — DX: Frequency of micturition: R35.0

## 2013-03-16 LAB — POCT HEMOGLOBIN-HEMACUE: Hemoglobin: 13.6 g/dL (ref 12.0–15.0)

## 2013-03-16 SURGERY — CYSTOSCOPY, WITH BLADDER HYDRODISTENSION
Anesthesia: General | Site: Bladder

## 2013-03-16 MED ORDER — PROPOFOL 10 MG/ML IV BOLUS
INTRAVENOUS | Status: DC | PRN
  Administered 2013-03-16: 150 mg via INTRAVENOUS

## 2013-03-16 MED ORDER — LIDOCAINE HCL (CARDIAC) 20 MG/ML IV SOLN
INTRAVENOUS | Status: DC | PRN
  Administered 2013-03-16: 50 mg via INTRAVENOUS

## 2013-03-16 MED ORDER — ONDANSETRON HCL 4 MG/2ML IJ SOLN
INTRAMUSCULAR | Status: DC | PRN
  Administered 2013-03-16: 4 mg via INTRAVENOUS

## 2013-03-16 MED ORDER — BUPIVACAINE HCL (PF) 0.25 % IJ SOLN
INTRAMUSCULAR | Status: DC | PRN
  Administered 2013-03-16: 30 mL

## 2013-03-16 MED ORDER — HYDROCODONE-ACETAMINOPHEN 5-325 MG PO TABS: 1.0000 | ORAL_TABLET | Freq: Four times a day (QID) | ORAL | Status: DC | PRN

## 2013-03-16 MED ORDER — STERILE WATER FOR IRRIGATION IR SOLN
Status: DC | PRN
  Administered 2013-03-16: 3000 mL

## 2013-03-16 MED ORDER — LACTATED RINGERS IV SOLN
INTRAVENOUS | Status: DC
  Administered 2013-03-16: 11:00:00 via INTRAVENOUS
  Filled 2013-03-16: qty 1000

## 2013-03-16 MED ORDER — KETOROLAC TROMETHAMINE 30 MG/ML IJ SOLN
INTRAMUSCULAR | Status: DC | PRN
  Administered 2013-03-16: 30 mg via INTRAVENOUS

## 2013-03-16 MED ORDER — DEXAMETHASONE SODIUM PHOSPHATE 4 MG/ML IJ SOLN
INTRAMUSCULAR | Status: DC | PRN
  Administered 2013-03-16: 10 mg via INTRAVENOUS

## 2013-03-16 MED ORDER — HYDROMORPHONE HCL PF 1 MG/ML IJ SOLN
0.2500 mg | INTRAMUSCULAR | Status: DC | PRN
  Filled 2013-03-16: qty 1

## 2013-03-16 MED ORDER — CEFAZOLIN SODIUM-DEXTROSE 2-3 GM-% IV SOLR
2.0000 g | INTRAVENOUS | Status: AC
  Administered 2013-03-16: 2 g via INTRAVENOUS
  Filled 2013-03-16: qty 50

## 2013-03-16 MED ORDER — FENTANYL CITRATE 0.05 MG/ML IJ SOLN
INTRAMUSCULAR | Status: DC | PRN
  Administered 2013-03-16: 25 ug via INTRAVENOUS
  Administered 2013-03-16: 50 ug via INTRAVENOUS
  Administered 2013-03-16: 25 ug via INTRAVENOUS

## 2013-03-16 MED ORDER — FENTANYL CITRATE 0.05 MG/ML IJ SOLN
INTRAMUSCULAR | Status: AC
  Filled 2013-03-16: qty 4

## 2013-03-16 MED ORDER — CEFAZOLIN SODIUM 1-5 GM-% IV SOLN
1.0000 g | INTRAVENOUS | Status: DC
  Filled 2013-03-16: qty 50

## 2013-03-16 MED ORDER — MEPERIDINE HCL 25 MG/ML IJ SOLN
6.2500 mg | INTRAMUSCULAR | Status: DC | PRN
  Filled 2013-03-16: qty 1

## 2013-03-16 MED ORDER — OXYCODONE HCL 5 MG/5ML PO SOLN
5.0000 mg | Freq: Once | ORAL | Status: DC | PRN
  Filled 2013-03-16: qty 5

## 2013-03-16 MED ORDER — LIDOCAINE HCL 2 % EX GEL
CUTANEOUS | Status: DC | PRN
  Administered 2013-03-16: 1 via URETHRAL

## 2013-03-16 MED ORDER — MIDAZOLAM HCL 2 MG/2ML IJ SOLN
INTRAMUSCULAR | Status: AC
  Filled 2013-03-16: qty 2

## 2013-03-16 MED ORDER — EPHEDRINE SULFATE 50 MG/ML IJ SOLN
INTRAMUSCULAR | Status: DC | PRN
  Administered 2013-03-16: 10 mg via INTRAVENOUS

## 2013-03-16 MED ORDER — OXYCODONE HCL 5 MG PO TABS
5.0000 mg | ORAL_TABLET | Freq: Once | ORAL | Status: DC | PRN
  Filled 2013-03-16: qty 1

## 2013-03-16 MED ORDER — PROMETHAZINE HCL 25 MG/ML IJ SOLN
6.2500 mg | INTRAMUSCULAR | Status: DC | PRN
  Filled 2013-03-16: qty 1

## 2013-03-16 SURGICAL SUPPLY — 20 items
BAG DRAIN URO-CYSTO SKYTR STRL (DRAIN) ×2 IMPLANT
CANISTER SUCT LVC 12 LTR MEDI- (MISCELLANEOUS) ×2 IMPLANT
CATH ROBINSON RED A/P 14FR (CATHETERS) IMPLANT
CATH ROBINSON RED A/P 16FR (CATHETERS) IMPLANT
CATH ROBINSON RED A/P 18FR (CATHETERS) IMPLANT
CLOTH BEACON ORANGE TIMEOUT ST (SAFETY) ×2 IMPLANT
DRAPE CAMERA CLOSED 9X96 (DRAPES) ×2 IMPLANT
ELECT REM PT RETURN 9FT ADLT (ELECTROSURGICAL)
ELECTRODE REM PT RTRN 9FT ADLT (ELECTROSURGICAL) IMPLANT
GLOVE BIO SURGEON STRL SZ7.5 (GLOVE) ×2 IMPLANT
GLOVE BIOGEL PI IND STRL 7.5 (GLOVE) ×2 IMPLANT
GLOVE BIOGEL PI INDICATOR 7.5 (GLOVE) ×2
GOWN PREVENTION PLUS LG XLONG (DISPOSABLE) ×2 IMPLANT
GOWN STRL REIN XL XLG (GOWN DISPOSABLE) ×2 IMPLANT
NEEDLE HYPO 22GX1.5 SAFETY (NEEDLE) IMPLANT
NS IRRIG 500ML POUR BTL (IV SOLUTION) IMPLANT
PACK CYSTOSCOPY (CUSTOM PROCEDURE TRAY) ×2 IMPLANT
SYR BULB IRRIGATION 50ML (SYRINGE) IMPLANT
WATER STERILE IRR 3000ML UROMA (IV SOLUTION) ×2 IMPLANT
WATER STERILE IRR 500ML POUR (IV SOLUTION) IMPLANT

## 2013-03-16 NOTE — Op Note (Signed)
Preoperative diagnosis:  Female pelvic pain and bladder overactivity Postoperative diagnosis: Same Procedure:  Cystoscopy and hydraulic overdistention of the bladder with instillation of  Marcaine Surgeon Valetta Fuller, MD Anesthesia: General  Indication: Allison Riley   Technique and findings:   The patient was brought to the operating room where She had successful induction of general anesthesia.The patient was placed in lithotomy position and prepped and draped in usual manner. Appropriate surgical timeout was performed.The patient received perioperative antibiotics. Initial cystoscopy revealed no abnormalities of the bladder. The patient underwent hydraulic overdistention of the bladder to 100 cm of water pressure for 5 minutes. Capacity was felt to be 1000 cc. inspection of the bladder after hydraulic overdistention revealed no evidence of any glomerular hemorrhaging. There was no cystoscopic evidence of interstitial cystitis. Marcaine was then instilled in the bladder.The patient was brought to recovery room in stable condition having no obvious complications or problems.   Valetta Fuller, MD 03/16/2013, 12:26 PM

## 2013-03-16 NOTE — Anesthesia Postprocedure Evaluation (Signed)
Anesthesia Post Note  Patient: Allison Riley  Procedure(s) Performed: Procedure(s) (LRB): CYSTOSCOPY/HYDRODISTENSION, INSTILLATION OF MARCAINE (N/A)  Anesthesia type: General  Patient location: PACU  Post pain: Pain level controlled  Post assessment: Post-op Vital signs reviewed  Last Vitals: BP 127/55  Pulse 67  Temp(Src) 36.4 C (Oral)  Resp 16  Ht 5\' 5"  (1.651 m)  Wt 130 lb (58.968 kg)  BMI 21.63 kg/m2  SpO2 97%  Post vital signs: Reviewed  Level of consciousness: sedated  Complications: No apparent anesthesia complications

## 2013-03-16 NOTE — Interval H&P Note (Signed)
History and Physical Interval Note:  03/16/2013 11:47 AM  Allison Riley  has presented today for surgery, with the diagnosis of Pelvic Pain  The various methods of treatment have been discussed with the patient and family. After consideration of risks, benefits and other options for treatment, the patient has consented to  Procedure(s) with comments: CYSTOSCOPY/HYDRODISTENSION, POSSIBLE INSTILLATION OF MARCAINE (N/A) - POSSIBLE INSTILLATION OF MARCAINE   as a surgical intervention .  The patient's history has been reviewed, patient examined, no change in status, stable for surgery.  I have reviewed the patient's chart and labs.  Questions were answered to the patient's satisfaction.     [name]

## 2013-03-16 NOTE — Anesthesia Preprocedure Evaluation (Signed)
Anesthesia Evaluation  Patient identified by MRN, date of birth, ID band Patient awake    Reviewed: Allergy & Precautions, H&P , NPO status , Patient's Chart, lab work & pertinent test results  Airway Mallampati: II TM Distance: >3 FB Neck ROM: Full    Dental  (+) Dental Advisory Given and Teeth Intact   Pulmonary neg pulmonary ROS, asthma ,  breath sounds clear to auscultation        Cardiovascular negative cardio ROS  Rhythm:Regular Rate:Normal     Neuro/Psych PSYCHIATRIC DISORDERS Depression negative neurological ROS     GI/Hepatic negative GI ROS, Neg liver ROS,   Endo/Other  negative endocrine ROS  Renal/GU negative Renal ROS     Musculoskeletal negative musculoskeletal ROS (+)   Abdominal   Peds  Hematology negative hematology ROS (+)   Anesthesia Other Findings   Reproductive/Obstetrics negative OB ROS                           Anesthesia Physical Anesthesia Plan  ASA: I  Anesthesia Plan: General   Post-op Pain Management:    Induction: Intravenous  Airway Management Planned: LMA  Additional Equipment:   Intra-op Plan:   Post-operative Plan: Extubation in OR  Informed Consent: I have reviewed the patients History and Physical, chart, labs and discussed the procedure including the risks, benefits and alternatives for the proposed anesthesia with the patient or authorized representative who has indicated his/her understanding and acceptance.   Dental advisory given  Plan Discussed with: CRNA  Anesthesia Plan Comments:         Anesthesia Quick Evaluation

## 2013-03-16 NOTE — Transfer of Care (Signed)
Immediate Anesthesia Transfer of Care Note  Patient: Allison Riley  Procedure(s) Performed: Procedure(s) (LRB): CYSTOSCOPY/HYDRODISTENSION, INSTILLATION OF MARCAINE (N/A)  Patient Location: PACU  Anesthesia Type: General  Level of Consciousness: awake, alert  and oriented  Airway & Oxygen Therapy: Patient Spontanous Breathing and Patient connected to face mask oxygen  Post-op Assessment: Report given to PACU RN and Post -op Vital signs reviewed and stable  Post vital signs: Reviewed and stable  Complications: No apparent anesthesia complications

## 2013-03-16 NOTE — Anesthesia Procedure Notes (Signed)
Procedure Name: LMA Insertion Date/Time: 03/16/2013 11:59 AM Performed by: Norva Pavlov Pre-anesthesia Checklist: Patient identified, Emergency Drugs available, Suction available and Patient being monitored Patient Re-evaluated:Patient Re-evaluated prior to inductionOxygen Delivery Method: Circle System Utilized Preoxygenation: Pre-oxygenation with 100% oxygen Intubation Type: IV induction Ventilation: Mask ventilation without difficulty LMA: LMA inserted LMA Size: 3.0 Number of attempts: 1 Airway Equipment and Method: bite block Placement Confirmation: positive ETCO2 Tube secured with: Tape Dental Injury: Teeth and Oropharynx as per pre-operative assessment

## 2013-03-17 ENCOUNTER — Encounter (HOSPITAL_BASED_OUTPATIENT_CLINIC_OR_DEPARTMENT_OTHER): Payer: Self-pay | Admitting: Urology

## 2013-04-20 ENCOUNTER — Telehealth: Payer: Self-pay | Admitting: Internal Medicine

## 2013-04-20 MED ORDER — SOLIFENACIN SUCCINATE 10 MG PO TABS: ORAL_TABLET | ORAL | Status: DC

## 2013-04-20 NOTE — Telephone Encounter (Signed)
Pt needs new rx vesicare 10 mg #90 w[facility] [phone]

## 2013-04-20 NOTE — Telephone Encounter (Signed)
rx sent in electronically 

## 2013-06-15 ENCOUNTER — Other Ambulatory Visit: Payer: Self-pay | Admitting: Internal Medicine

## 2013-07-08 ENCOUNTER — Other Ambulatory Visit: Payer: Self-pay | Admitting: Internal Medicine

## 2013-07-27 LAB — HM DEXA SCAN

## 2013-07-27 LAB — HM MAMMOGRAPHY: HM Mammogram: NORMAL

## 2013-08-20 ENCOUNTER — Other Ambulatory Visit (INDEPENDENT_AMBULATORY_CARE_PROVIDER_SITE_OTHER): Payer: BC Managed Care – PPO

## 2013-08-20 DIAGNOSIS — Z0000 Encounter for general adult medical examination without abnormal findings: Secondary | ICD-10-CM

## 2013-08-20 LAB — POCT URINALYSIS DIPSTICK
BILIRUBIN UA: NEGATIVE
Glucose, UA: NEGATIVE
KETONES UA: NEGATIVE
Leukocytes, UA: NEGATIVE
Nitrite, UA: NEGATIVE
PH UA: 8.5
Spec Grav, UA: 1.015
Urobilinogen, UA: 0.2

## 2013-08-20 LAB — CBC WITH DIFFERENTIAL/PLATELET
BASOS ABS: 0 10*3/uL (ref 0.0–0.1)
Basophils Relative: 0.4 % (ref 0.0–3.0)
EOS ABS: 0.7 10*3/uL (ref 0.0–0.7)
Eosinophils Relative: 13.3 % — ABNORMAL HIGH (ref 0.0–5.0)
HCT: 40.1 % (ref 36.0–46.0)
Hemoglobin: 13.6 g/dL (ref 12.0–15.0)
Lymphocytes Relative: 22.6 % (ref 12.0–46.0)
Lymphs Abs: 1.2 10*3/uL (ref 0.7–4.0)
MCHC: 33.9 g/dL (ref 30.0–36.0)
MCV: 93 fl (ref 78.0–100.0)
Monocytes Absolute: 0.4 10*3/uL (ref 0.1–1.0)
Monocytes Relative: 7.5 % (ref 3.0–12.0)
NEUTROS PCT: 56.2 % (ref 43.0–77.0)
Neutro Abs: 2.9 10*3/uL (ref 1.4–7.7)
Platelets: 166 10*3/uL (ref 150.0–400.0)
RBC: 4.31 Mil/uL (ref 3.87–5.11)
RDW: 14.4 % (ref 11.5–15.5)
WBC: 5.2 10*3/uL (ref 4.0–10.5)

## 2013-08-20 LAB — HEPATIC FUNCTION PANEL
ALT: 15 U/L (ref 0–35)
AST: 17 U/L (ref 0–37)
Albumin: 3.9 g/dL (ref 3.5–5.2)
Alkaline Phosphatase: 59 U/L (ref 39–117)
BILIRUBIN DIRECT: 0 mg/dL (ref 0.0–0.3)
BILIRUBIN TOTAL: 0.5 mg/dL (ref 0.2–1.2)
TOTAL PROTEIN: 6.4 g/dL (ref 6.0–8.3)

## 2013-08-20 LAB — LIPID PANEL
Cholesterol: 250 mg/dL — ABNORMAL HIGH (ref 0–200)
HDL: 69.4 mg/dL (ref >39.00)
LDL Cholesterol: 155 mg/dL — ABNORMAL HIGH (ref 0–99)
TRIGLYCERIDES: 127 mg/dL (ref 0.0–149.0)
Total CHOL/HDL Ratio: 4
VLDL: 25.4 mg/dL (ref 0.0–40.0)

## 2013-08-20 LAB — BASIC METABOLIC PANEL
BUN: 21 mg/dL (ref 6–23)
CALCIUM: 9.2 mg/dL (ref 8.4–10.5)
CO2: 28 meq/L (ref 19–32)
Chloride: 104 mEq/L (ref 96–112)
Creatinine, Ser: 0.7 mg/dL (ref 0.4–1.2)
GFR: 95.06 mL/min (ref >60.00)
GLUCOSE: 89 mg/dL (ref 70–99)
Potassium: 4.1 mEq/L (ref 3.5–5.1)
Sodium: 138 mEq/L (ref 135–145)

## 2013-08-20 LAB — TSH: TSH: 7.36 u[IU]/mL — ABNORMAL HIGH (ref 0.35–4.50)

## 2013-08-26 ENCOUNTER — Ambulatory Visit (INDEPENDENT_AMBULATORY_CARE_PROVIDER_SITE_OTHER): Payer: BC Managed Care – PPO | Admitting: Internal Medicine

## 2013-08-26 ENCOUNTER — Encounter: Payer: Self-pay | Admitting: Internal Medicine

## 2013-08-26 VITALS — BP 122/74 | HR 68 | Temp 98.6°F | Ht 64.5 in | Wt 134.0 lb

## 2013-08-26 DIAGNOSIS — Z0000 Encounter for general adult medical examination without abnormal findings: Secondary | ICD-10-CM

## 2013-08-26 DIAGNOSIS — Z2911 Encounter for prophylactic immunotherapy for respiratory syncytial virus (RSV): Secondary | ICD-10-CM

## 2013-08-26 DIAGNOSIS — Z23 Encounter for immunization: Secondary | ICD-10-CM

## 2013-08-26 MED ORDER — SOLIFENACIN SUCCINATE 10 MG PO TABS: ORAL_TABLET | ORAL | Status: DC

## 2013-08-26 MED ORDER — LEVOTHYROXINE SODIUM 50 MCG PO TABS: ORAL_TABLET | ORAL | Status: DC

## 2013-08-26 MED ORDER — SERTRALINE HCL 100 MG PO TABS: 100.0000 mg | ORAL_TABLET | Freq: Every day | ORAL | Status: DC

## 2013-08-26 NOTE — Progress Notes (Addendum)
Subjective:    Patient ID: Allison Riley, female    DOB: 08/02/1945, 68 y.o.   MRN: [id number]  HPI  68 year old White female with history of adjustment disorder with depressive symptoms, hyperlipidemia And overactive bladder for routine physical. Patient denies any significant interval medical history-patient plans to retire from her job at [employer] this year.  Patient reports completing mammogram and bone density scan last month as per her gynecologist. Her mammogram was unremarkable and bone density scan showed osteopenia.  Review of Systems  Constitutional: Negative for activity change, appetite change and unexpected weight change.  Eyes: Negative for visual disturbance.  Respiratory: Negative for cough, chest tightness and shortness of breath.   Cardiovascular: Negative for chest pain.  Genitourinary: Negative for difficulty urinating.  Neurological: Negative for headaches.  Gastrointestinal: Negative for abdominal pain, heartburn melena or hematochezia Psych: Negative for depression or anxiety Endo:  No polyuria or polydypsia     Past Medical History  Diagnosis Date  . Depression   . Hyperlipidemia   . History of colon polyps   . Frequency of urination   . Urgency of urination   . Pelvic pain   . Dry eyes     History   Social History  . Marital Status: Divorced    Spouse Name: N/A    Number of Children: N/A  . Years of Education: N/A   Occupational History  . Not on file.   Social History Main Topics  . Smoking status: Never Smoker   . Smokeless tobacco: Never Used  . Alcohol Use: 8.4 oz/week    14 Glasses of wine per week     Comment: 2 WINE DAILY  . Drug Use: No  . Sexual Activity: Not Currently   Other Topics Concern  . Not on file   Social History Narrative   [employer] admissions      Past Surgical History  Procedure Laterality Date  . 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 Laparoscopic assisted vaginal hysterectomy  03-28-2006    W/  BILATERAL SALPINGOOPHORECTOMY  . Repair vaginal cuff  04-21-2006  . Bunionectomy Left 08/2008  . Cataract extraction w/ intraocular lens  implant, bilateral    . Nasal sinus surgery  1994  . Wisdom tooth extraction    . Cysto with hydrodistension N/A 03/16/2013    Procedure: CYSTOSCOPY/HYDRODISTENSION, INSTILLATION OF MARCAINE;  Surgeon: Valetta Fulleravid S Grapey, MD;  [facility];  Service: Urology;  Laterality: N/A;  POSSIBLE INSTILLATION OF MARCAINE      Family History  Problem Relation Age of Onset  . Cancer Father [id number]    deceased age 67 - GI cancer  . Suicidality Mother     mother died of suicide age 67  . Drug abuse Brother     lawyer-addicted to prescription drugs  . Cancer - Other Maternal Grandfather     Bone cancer  . Breast cancer Maternal Grandmother     No Known Allergies  Current Outpatient Prescriptions on File Prior to Visit  Medication Sig Dispense Refill  . Calcium Carb-Cholecalciferol (CALCIUM 600 + D PO) Take by mouth daily.      . cycloSPORINE (RESTASIS) 0.05 % ophthalmic emulsion 1 drop 2 (two) times daily.      Marland Kitchen. estradiol (VIVELLE-DOT) 0.05 MG/24HR patch Place 1 patch onto the skin once a week.      . Multiple Vitamin (MULTIVITAMIN) tablet Take 1 tablet by mouth daily.      . [  DISCONTINUED] solifenacin (VESICARE) 10 MG tablet Take 1 tablet (10 mg total) by mouth daily.  90 tablet  1   No current facility-administered medications on file prior to visit.    BP 122/74  Pulse 68  Temp(Src) 98.6 F (37 C) (Oral)  Ht 5' 4.5" (1.638 m)  Wt 134 lb (60.782 kg)  BMI 22.65 kg/m2      Objective:   Physical Exam  Constitutional: She is oriented to person, place, and time. She appears well-developed and well-nourished. No distress.  HENT:  Head: Normocephalic and atraumatic.  Right Ear: External ear normal.  Left Ear: External ear normal.  Mouth/Throat: Oropharynx is clear and moist.  Eyes: Conjunctivae and EOM are normal. Pupils are  equal, round, and reactive to light.  Neck: Neck supple. No thyromegaly present.  No carotid bruit  Cardiovascular: Normal rate, regular rhythm and normal heart sounds.   No murmur heard. Pulmonary/Chest: Effort normal and breath sounds normal. She has no wheezes.  Abdominal: Soft. Bowel sounds are normal. She exhibits no distension and no mass. There is no tenderness.  Musculoskeletal: She exhibits no edema.  Lymphadenopathy:    She has no cervical adenopathy.  Neurological: She is alert and oriented to person, place, and time.  Skin: Skin is warm and dry. No erythema.  Psychiatric: She has a normal mood and affect. Her behavior is normal.          Assessment & Plan:

## 2013-08-26 NOTE — Progress Notes (Signed)
Pre visit review using our clinic review tool, if applicable. No additional management support is needed unless otherwise documented below in the visit note. 

## 2013-08-26 NOTE — Assessment & Plan Note (Signed)
Reviewed adult health maintenance protocols.  Patient reports recent mammogram one month ago was normal which was ordered by her gynecologist. Bone density showed osteopenia. She is up-to-date with colonoscopy. Patient updated with Pneumovax and shingles vaccine.

## 2013-11-19 ENCOUNTER — Other Ambulatory Visit (INDEPENDENT_AMBULATORY_CARE_PROVIDER_SITE_OTHER): Payer: PRIVATE HEALTH INSURANCE

## 2013-11-19 DIAGNOSIS — E039 Hypothyroidism, unspecified: Secondary | ICD-10-CM

## 2013-11-19 LAB — TSH: TSH: 1.14 u[IU]/mL (ref 0.35–4.50)

## 2013-11-19 LAB — T4, FREE: Free T4: 0.69 ng/dL (ref 0.60–1.60)

## 2013-11-19 LAB — T3, FREE: T3, Free: 2.5 pg/mL (ref 2.3–4.2)

## 2013-11-23 ENCOUNTER — Other Ambulatory Visit: Payer: Self-pay | Admitting: *Deleted

## 2013-11-23 MED ORDER — LEVOTHYROXINE SODIUM 50 MCG PO TABS: 50.0000 ug | ORAL_TABLET | Freq: Every day | ORAL | Status: DC

## 2014-02-24 ENCOUNTER — Telehealth: Payer: Self-pay | Admitting: Internal Medicine

## 2014-02-24 DIAGNOSIS — L600 Ingrowing nail: Secondary | ICD-10-CM

## 2014-02-24 NOTE — Telephone Encounter (Signed)
Pt has moved to the [place] an needs to see a podiatrist for an ingrown toenail and possible infection. Pt needs referral due to medicare and [employer]. Would like to know if dr Artist Paisyoo can put that in?  [facility] [phone] Dr Nils Flackaniel waldman They have an appt at 1:30 today if any way possible to get that in for pt? Pl advise

## 2014-02-24 NOTE — Telephone Encounter (Signed)
Ok for referral?

## 2014-02-25 NOTE — Telephone Encounter (Signed)
Referral order placed.

## 2014-03-15 ENCOUNTER — Telehealth: Payer: Self-pay | Admitting: Internal Medicine

## 2014-03-15 MED ORDER — LEVOTHYROXINE SODIUM 50 MCG PO TABS: 50.0000 ug | ORAL_TABLET | Freq: Every day | ORAL | Status: DC

## 2014-03-15 NOTE — Telephone Encounter (Signed)
Ok per Dr Artist PaisYoo, rx up front for p/u, pt aware

## 2014-03-15 NOTE — Telephone Encounter (Signed)
Pt is in town today and would like written rx levothyroxine 50 mcg #90 w/refills . Pt does not want rx call to pharm she is shopping around for lower price. Pt would like to pick up rx today and no later than tomorrow.

## 2014-03-16 ENCOUNTER — Other Ambulatory Visit: Payer: Self-pay | Admitting: Internal Medicine

## 2014-04-27 IMAGING — CR DG CHEST 2V
2 series · 2 of 2 positions shown · non-contrast
Comparison: 06/27/2010

CLINICAL DATA: Chronic cough, shortness of breath

CHEST - 2 VIEW

[view not recorded (1 of 2)]
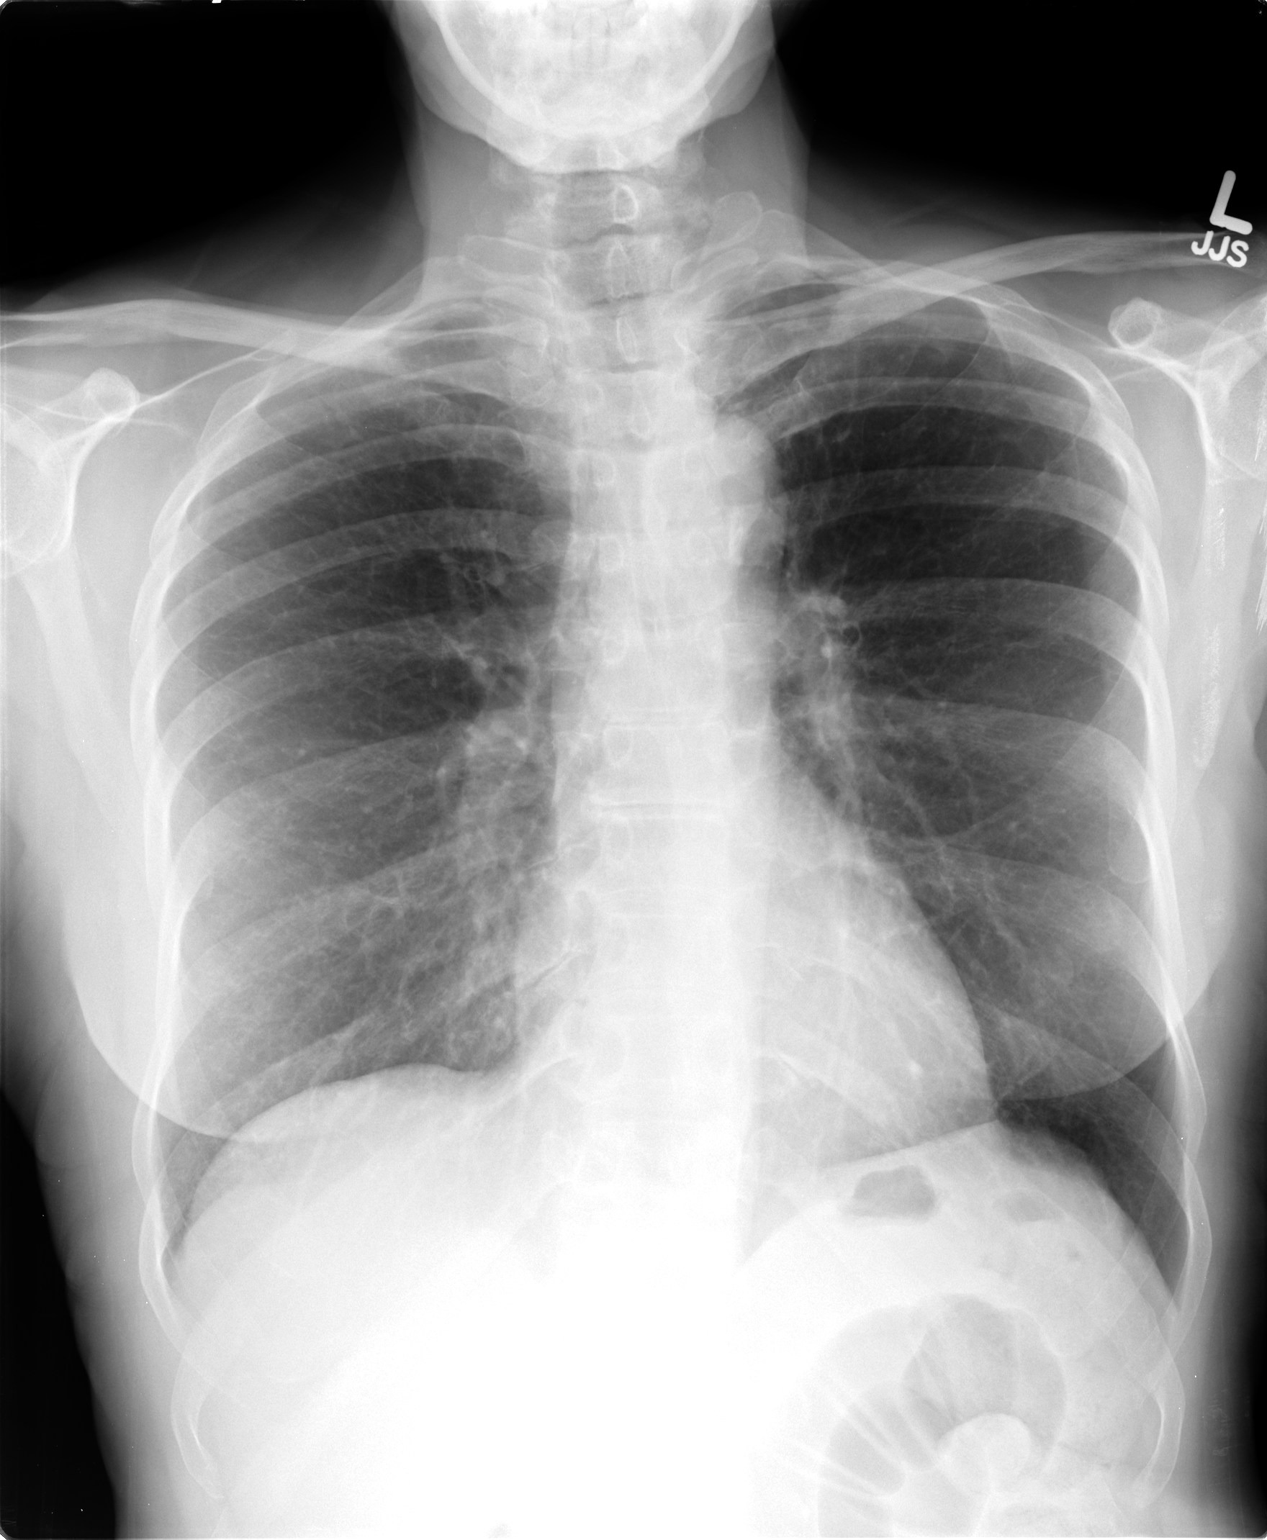

[view not recorded (2 of 2)]
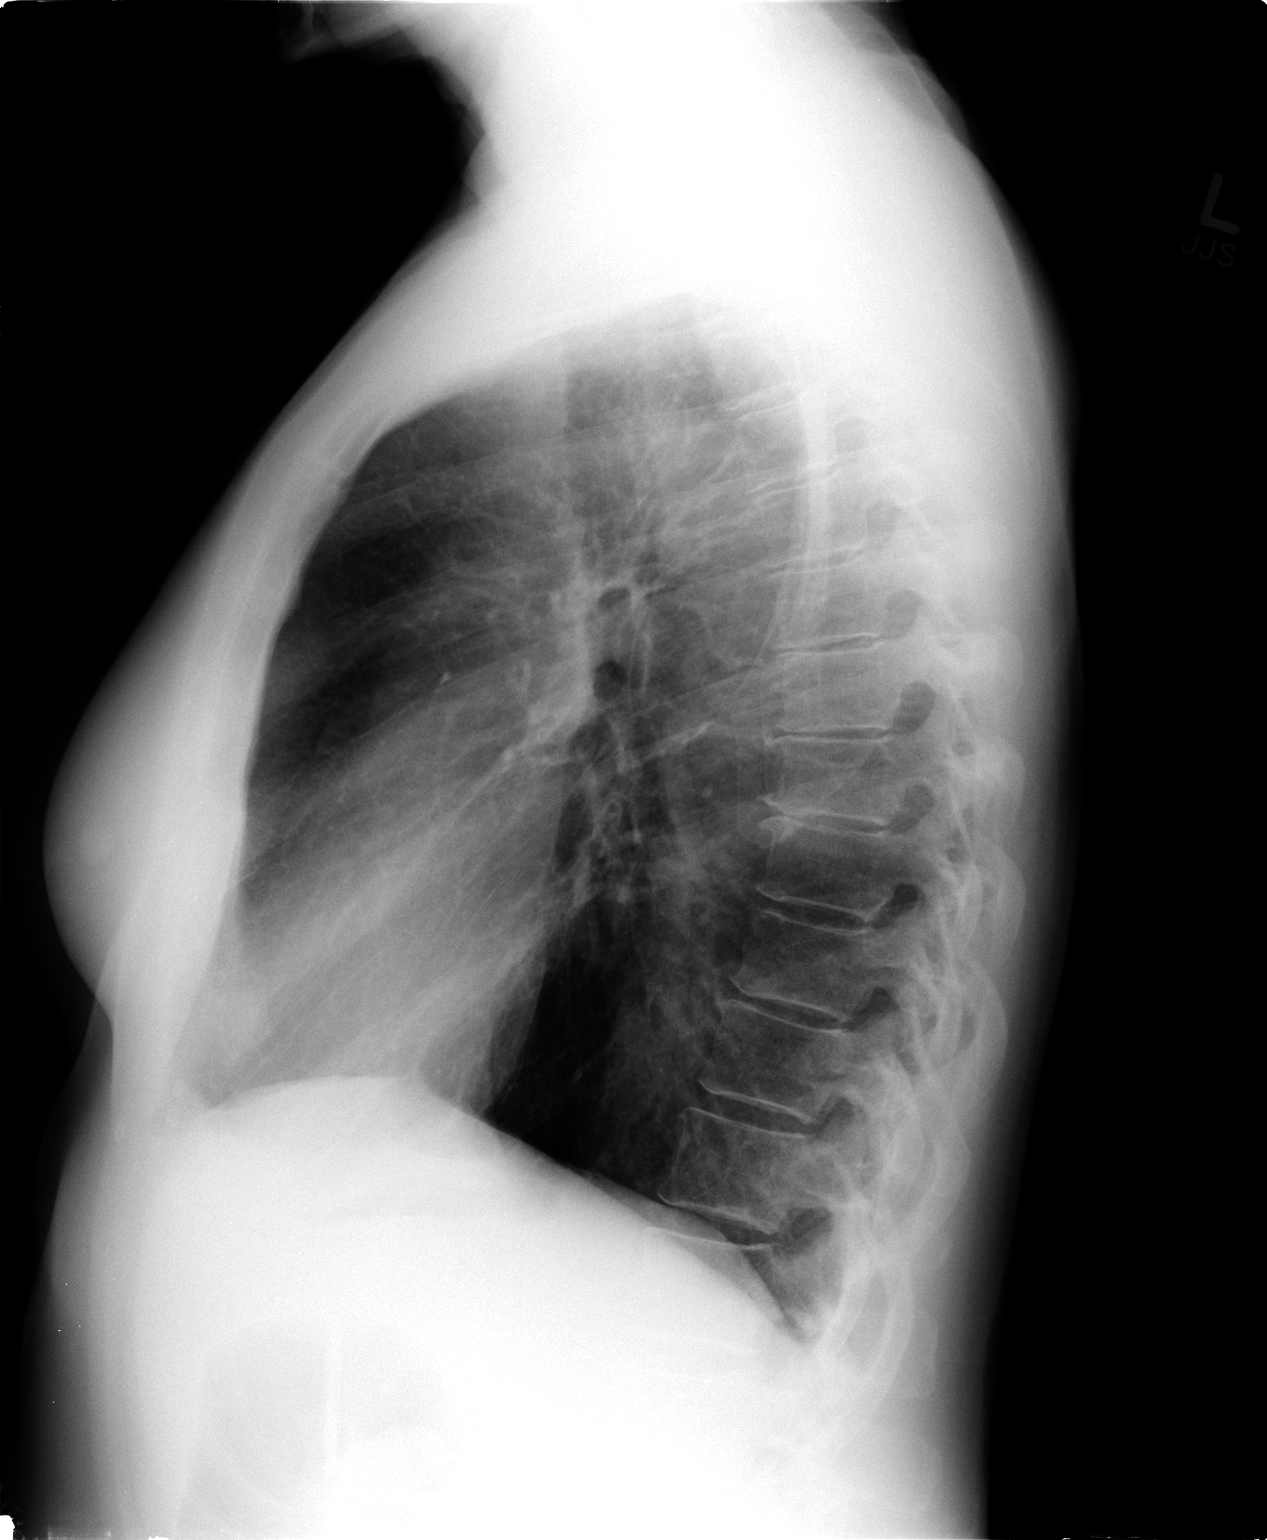

[2 of 2 positions shown; findings below may reference images not displayed]

FINDINGS: Lungs are clear. No pleural effusion or pneumothorax.

Cardiomediastinal silhouette is within normal limits.

Mild degenerative changes of the visualized thoracolumbar spine.
IMPRESSION: No evidence of acute cardiopulmonary disease.

## 2014-05-13 ENCOUNTER — Other Ambulatory Visit: Payer: Self-pay | Admitting: Internal Medicine

## 2014-07-26 ENCOUNTER — Other Ambulatory Visit: Payer: Self-pay | Admitting: Internal Medicine

## 2014-09-06 ENCOUNTER — Other Ambulatory Visit: Payer: Self-pay | Admitting: Internal Medicine

## 2014-09-29 ENCOUNTER — Other Ambulatory Visit: Payer: Self-pay | Admitting: Internal Medicine

## 2014-10-05 ENCOUNTER — Other Ambulatory Visit: Payer: Self-pay | Admitting: Obstetrics & Gynecology

## 2014-10-07 LAB — CYTOLOGY - PAP

## 2014-10-26 ENCOUNTER — Other Ambulatory Visit: Payer: Self-pay | Admitting: Internal Medicine

## 2014-11-23 ENCOUNTER — Other Ambulatory Visit: Payer: Self-pay | Admitting: Internal Medicine

## 2014-12-20 ENCOUNTER — Other Ambulatory Visit: Payer: Self-pay | Admitting: Internal Medicine

## 2015-01-13 ENCOUNTER — Other Ambulatory Visit: Payer: Self-pay | Admitting: Internal Medicine
